# Patient Record
Sex: Female | Born: 1985 | Race: White | Hispanic: No | Marital: Single | Smoking: Never smoker
Health system: Southern US, Community
[De-identification: ages and names within clinical notes are randomized; demographics above are authoritative.]

## PROBLEM LIST (undated history)

## (undated) DIAGNOSIS — F419 Anxiety disorder, unspecified: Secondary | ICD-10-CM

## (undated) DIAGNOSIS — F32A Depression, unspecified: Secondary | ICD-10-CM

## (undated) DIAGNOSIS — E282 Polycystic ovarian syndrome: Secondary | ICD-10-CM

## (undated) DIAGNOSIS — N200 Calculus of kidney: Secondary | ICD-10-CM

## (undated) HISTORY — PX: TYMPANOSTOMY TUBE PLACEMENT: SHX32

---

## 2003-05-01 ENCOUNTER — Emergency Department (HOSPITAL_COMMUNITY): Admission: EM | Admit: 2003-05-01 | Discharge: 2003-05-01 | Payer: Self-pay | Admitting: Emergency Medicine

## 2006-10-15 ENCOUNTER — Other Ambulatory Visit: Admission: RE | Admit: 2006-10-15 | Discharge: 2006-10-15 | Payer: Self-pay | Admitting: Family Medicine

## 2008-11-09 ENCOUNTER — Other Ambulatory Visit: Admission: RE | Admit: 2008-11-09 | Discharge: 2008-11-09 | Payer: Self-pay | Admitting: Family Medicine

## 2011-10-05 ENCOUNTER — Emergency Department (HOSPITAL_COMMUNITY): Payer: BC Managed Care – PPO

## 2011-10-05 ENCOUNTER — Emergency Department (HOSPITAL_COMMUNITY)
Admission: EM | Admit: 2011-10-05 | Discharge: 2011-10-05 | Discharge status: Against Medical Advice | Payer: BC Managed Care – PPO | Attending: Emergency Medicine | Admitting: Emergency Medicine

## 2011-10-05 ENCOUNTER — Encounter: Payer: Self-pay | Admitting: Emergency Medicine

## 2011-10-05 DIAGNOSIS — N200 Calculus of kidney: Secondary | ICD-10-CM | POA: Insufficient documentation

## 2011-10-05 DIAGNOSIS — R197 Diarrhea, unspecified: Secondary | ICD-10-CM | POA: Insufficient documentation

## 2011-10-05 DIAGNOSIS — N201 Calculus of ureter: Secondary | ICD-10-CM

## 2011-10-05 DIAGNOSIS — R109 Unspecified abdominal pain: Secondary | ICD-10-CM | POA: Insufficient documentation

## 2011-10-05 DIAGNOSIS — R112 Nausea with vomiting, unspecified: Secondary | ICD-10-CM | POA: Insufficient documentation

## 2011-10-05 LAB — BASIC METABOLIC PANEL
Calcium: 9.3 mg/dL (ref 8.4–10.5)
Creatinine, Ser: 0.89 mg/dL (ref 0.50–1.10)
GFR calc Af Amer: >90 mL/min (ref >90)

## 2011-10-05 LAB — URINE MICROSCOPIC-ADD ON

## 2011-10-05 LAB — CBC
MCH: 27.7 pg (ref 26.0–34.0)
MCV: 83.9 fL (ref 78.0–100.0)
Platelets: 246 10*3/uL (ref 150–400)
RDW: 13.6 % (ref 11.5–15.5)

## 2011-10-05 LAB — URINALYSIS, ROUTINE W REFLEX MICROSCOPIC
Bilirubin Urine: NEGATIVE
Ketones, ur: NEGATIVE mg/dL
Nitrite: NEGATIVE
Protein, ur: NEGATIVE mg/dL
Urobilinogen, UA: 0.2 mg/dL (ref 0.0–1.0)

## 2011-10-05 IMAGING — CT CT ABD-PELV W/ CM
2 of 4 series · 17 of 46 positions shown, 19 images · IV contrast (omnipaque)
Comparison: None.

CLINICAL DATA: Right lower quadrant abdominal pain.

CT ABDOMEN AND PELVIS WITH CONTRAST
TECHNIQUE: Multidetector CT imaging of the abdomen and pelvis was
performed following the standard protocol during bolus
administration of intravenous contrast.
Contrast: 100mL OMNIPAQUE IOHEXOL 300 MG/ML IV SOLN

[Series 2: abd/pel with · axial · 0.74mm/px · z∈[-368,+12]mm · 14 of 84 slices shown, 16 images]
[im 4/84  soft-tissue]
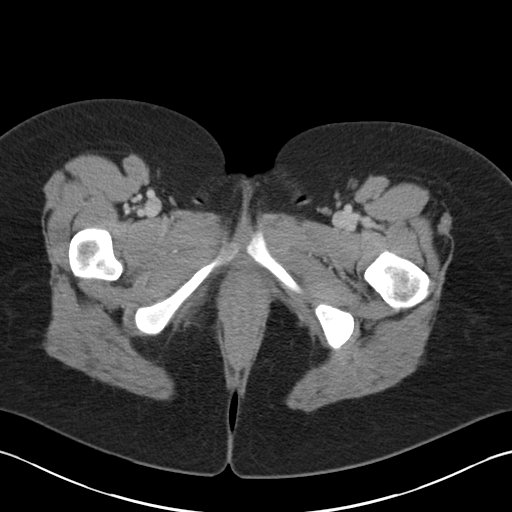
[im 4/84  bone]
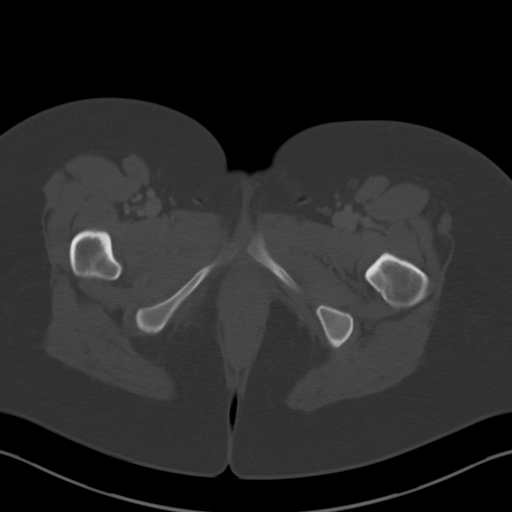
[im 10/84  soft-tissue]
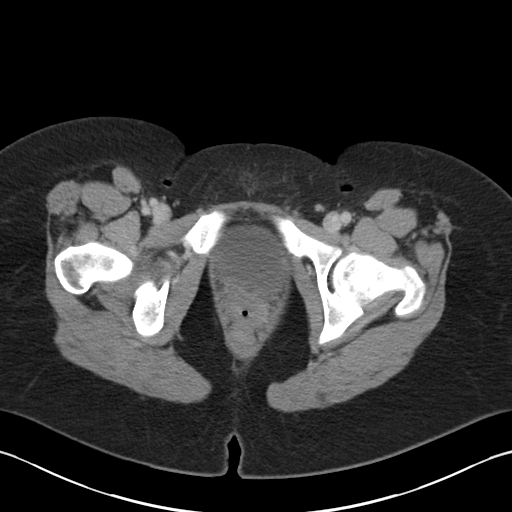
[im 16/84  soft-tissue]
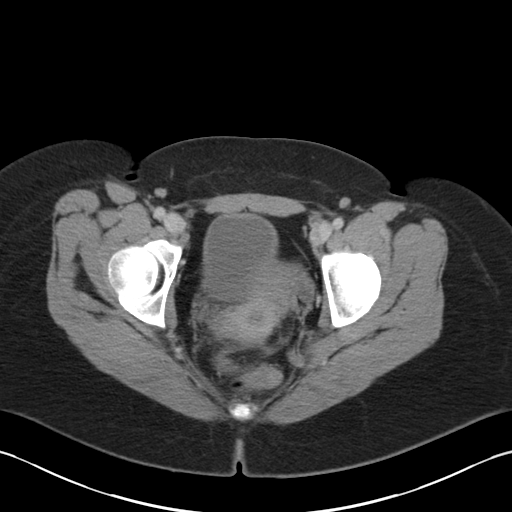
[im 23/84  soft-tissue]
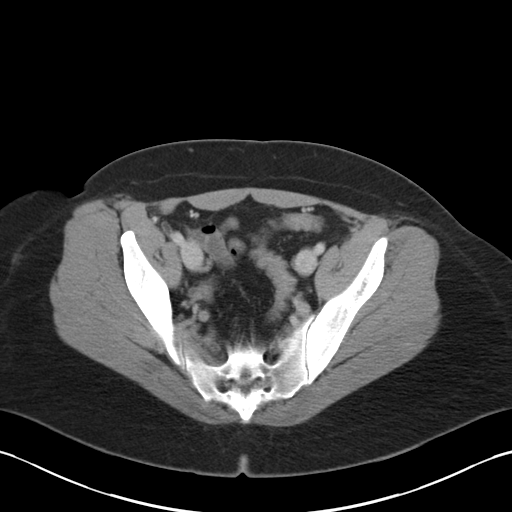
[im 29/84  soft-tissue]
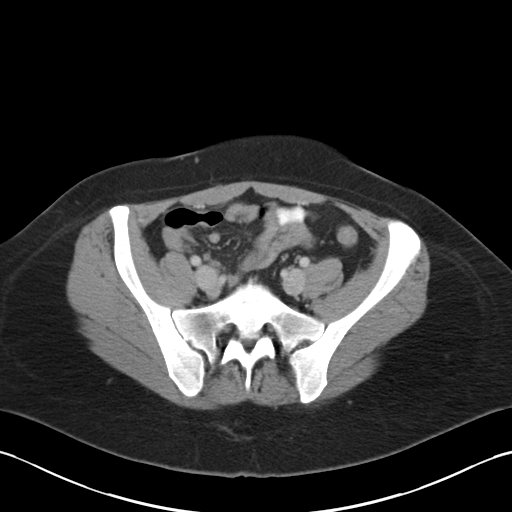
[im 32/84  soft-tissue]
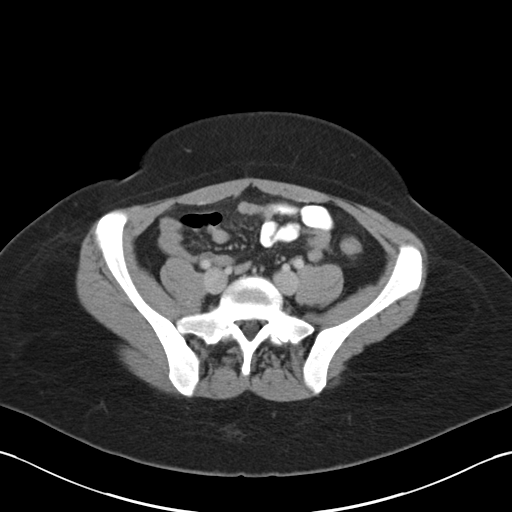
[im 39/84  soft-tissue]
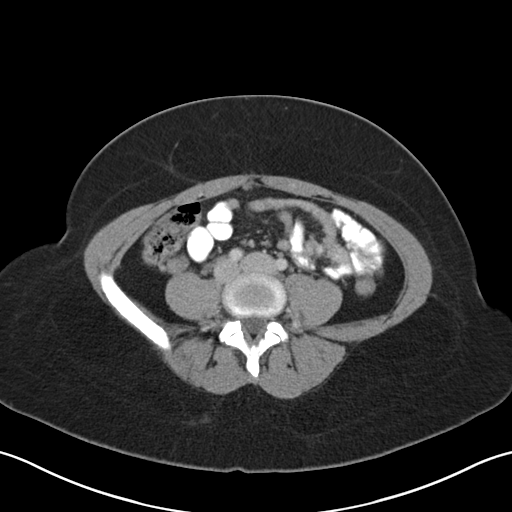
[im 45/84  soft-tissue]
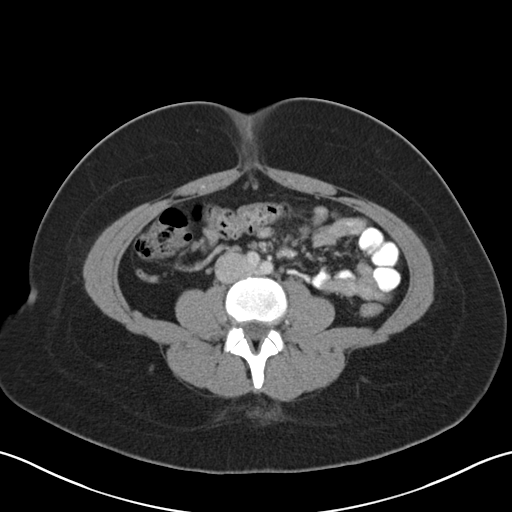
[im 52/84  soft-tissue]
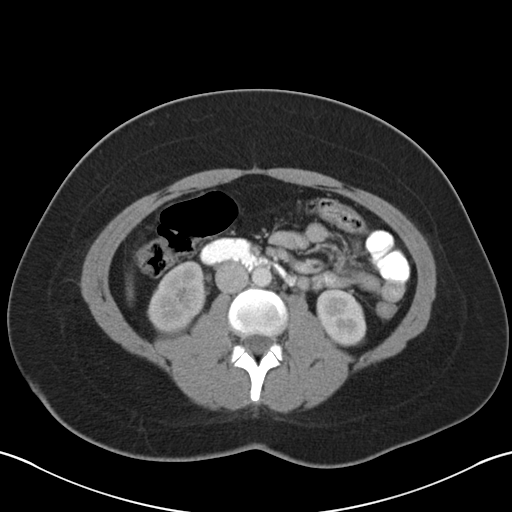
[im 52/84  bone]
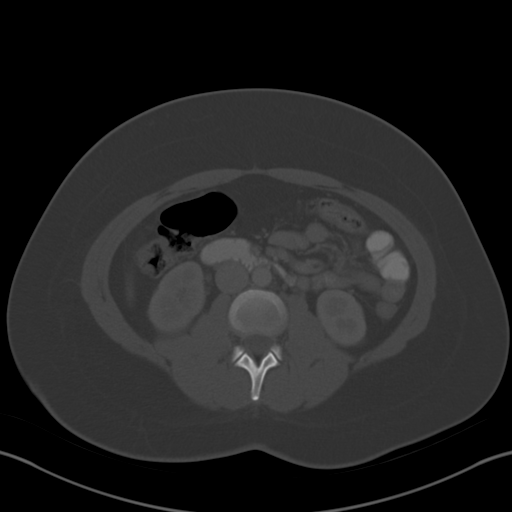
[im 55/84  soft-tissue]
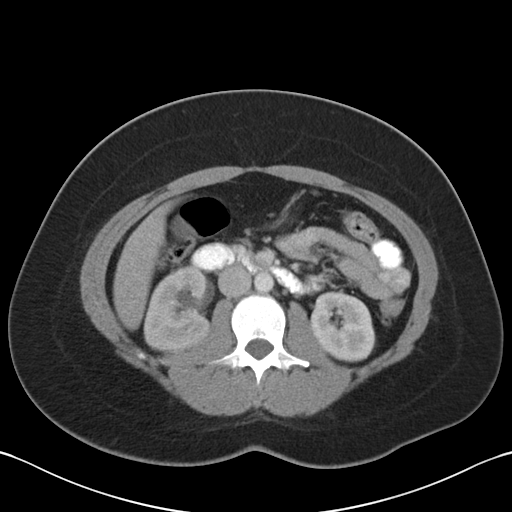
[im 61/84  soft-tissue]
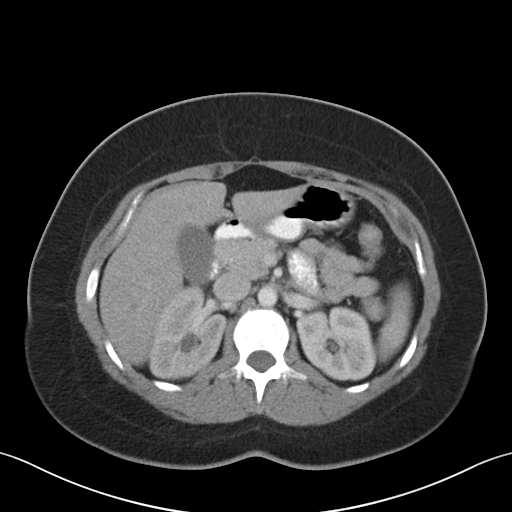
[im 68/84  soft-tissue]
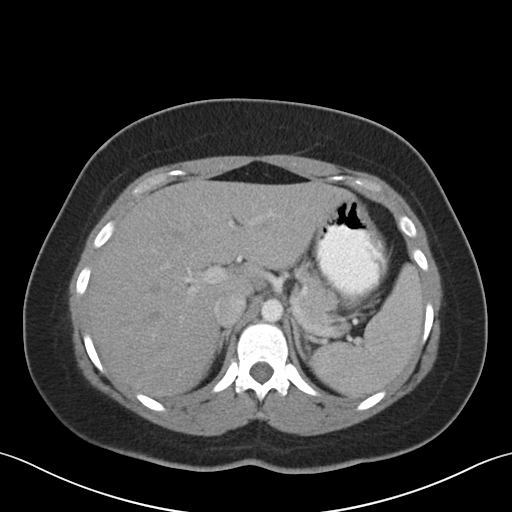
[im 74/84  soft-tissue]
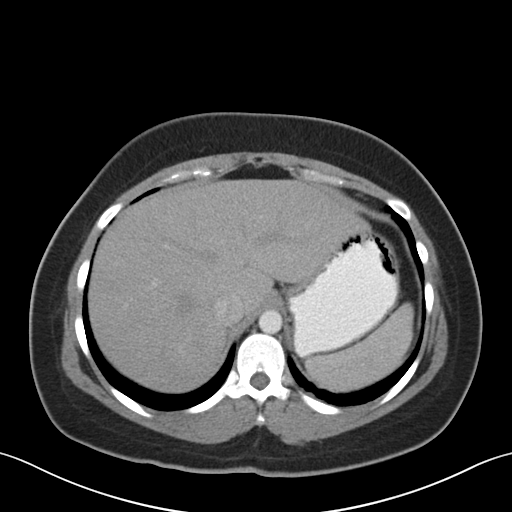
[im 80/84  soft-tissue]
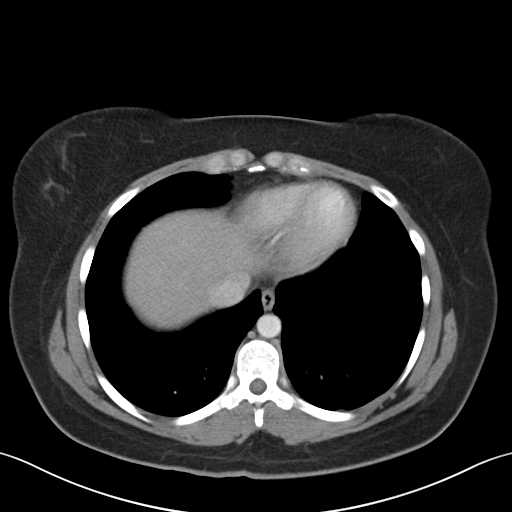

[Series 5: coronal · coronal · 0.81mm/px · 3 of 54 slices shown]
[im 18/54  soft-tissue]
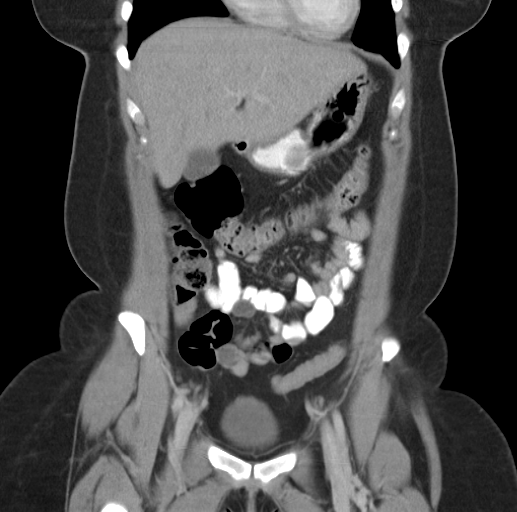
[im 24/54  soft-tissue]
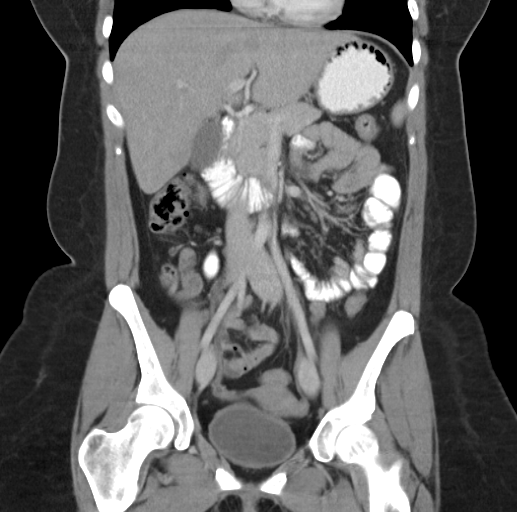
[im 30/54  soft-tissue]
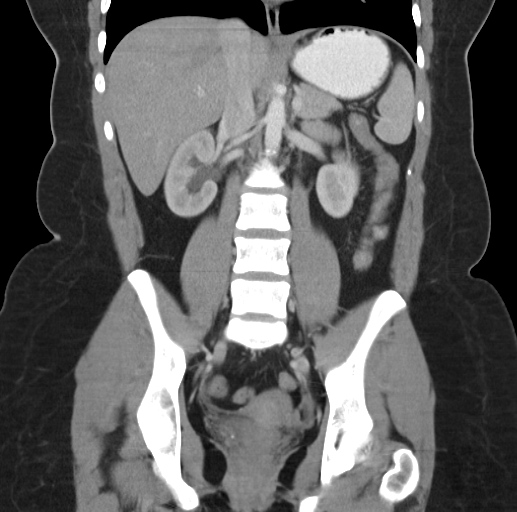

[17 of 46 positions shown; findings below may reference images not displayed]

FINDINGS: There is a 3 mm stone at the right ureterovesical
junction. This causes mild right sided hydroureteronephrosis.
Enhancement of the right kidney is slightly less than the left
kidney, likely related to obstruction.  No definite CT findings of
pyelonephritis.  There is no perinephric stranding.

No definite intrarenal calculi in the right or left kidney (the
sensitivity for detecting intrarenal calculi is decreased on
postcontrast imaging).

There is no hydronephrosis on the left.  The left ureter is normal
in caliber.

The appendix is retrocecal and is normal.

The liver, gallbladder, spleen, adrenal glands, pancreas, and
gallbladder are within normal limits.  Bowel loops are normal in
caliber.  The terminal ileum appears normal.  The uterus and adnexa
are within normal limits.  Urinary bladder appears normal.

There is no ascites or lymphadenopathy.

The abdominal aorta is normal in caliber.

The imaged bones are within normal limits.
IMPRESSION: 1.  3 mm right ureterovesical junction stone results in mild right
hydroureteronephrosis.
2.  Normal appendix.

## 2011-10-05 MED ORDER — MORPHINE SULFATE 2 MG/ML IJ SOLN
INTRAMUSCULAR | Status: AC
  Administered 2011-10-05: 4 mg via INTRAVASCULAR
  Filled 2011-10-05: qty 2

## 2011-10-05 MED ORDER — MORPHINE SULFATE 4 MG/ML IJ SOLN: 4.0000 mg | Freq: Once | INTRAMUSCULAR | Status: DC

## 2011-10-05 MED ORDER — SODIUM CHLORIDE 0.9 % IV BOLUS (SEPSIS)
250.0000 mL | Freq: Once | INTRAVENOUS | Status: AC
  Administered 2011-10-05: 250 mL via INTRAVENOUS

## 2011-10-05 MED ORDER — ONDANSETRON HCL 4 MG PO TABS: 4.0000 mg | ORAL_TABLET | Freq: Four times a day (QID) | ORAL | Status: AC

## 2011-10-05 MED ORDER — OXYCODONE-ACETAMINOPHEN 5-325 MG PO TABS: 1.0000 | ORAL_TABLET | Freq: Four times a day (QID) | ORAL | Status: AC | PRN

## 2011-10-05 MED ORDER — MORPHINE SULFATE 4 MG/ML IJ SOLN: 4.0000 mg | Freq: Once | INTRAMUSCULAR | Status: AC

## 2011-10-05 MED ORDER — MORPHINE SULFATE 2 MG/ML IJ SOLN
INTRAMUSCULAR | Status: AC
  Administered 2011-10-05: 4 mg via INTRAVENOUS
  Filled 2011-10-05: qty 2

## 2011-10-05 MED ORDER — ONDANSETRON HCL 4 MG/2ML IJ SOLN
4.0000 mg | Freq: Once | INTRAMUSCULAR | Status: AC
  Administered 2011-10-05: 4 mg via INTRAVENOUS
  Filled 2011-10-05: qty 2

## 2011-10-05 MED ORDER — MORPHINE SULFATE 4 MG/ML IJ SOLN
4.0000 mg | Freq: Once | INTRAMUSCULAR | Status: AC
  Administered 2011-10-05: 4 mg via INTRAVENOUS

## 2011-10-05 MED ORDER — MORPHINE SULFATE 10 MG/ML IJ SOLN
INTRAMUSCULAR | Status: AC
  Filled 2011-10-05: qty 1

## 2011-10-05 MED ORDER — KETOROLAC TROMETHAMINE 30 MG/ML IJ SOLN
30.0000 mg | Freq: Once | INTRAMUSCULAR | Status: AC
Start: 2011-10-05 — End: 2011-10-05
  Administered 2011-10-05: 30 mg via INTRAVENOUS
  Filled 2011-10-05: qty 1

## 2011-10-05 MED ORDER — IOHEXOL 300 MG/ML  SOLN
100.0000 mL | Freq: Once | INTRAMUSCULAR | Status: AC | PRN
  Administered 2011-10-05: 100 mL via INTRAVENOUS

## 2011-10-05 NOTE — ED Notes (Signed)
Pt. Given a urine cup but can't void at present moment.

## 2011-10-05 NOTE — ED Provider Notes (Signed)
CT History     CSN: [id number] Arrival date & time: 10/05/2011  9:58 AM   First MD Initiated Contact with Patient 10/05/11 1009      Chief Complaint  Patient presents with  . Abdominal Pain    Sudden onset  . Diarrhea    (Consider location/radiation/quality/duration/timing/severity/associated sxs/prior treatment) Patient is a 25 y.o. female presenting with abdominal pain.  Abdominal Pain The primary symptoms of the illness include abdominal pain, nausea, vomiting and diarrhea. The primary symptoms of the illness do not include fever, fatigue, shortness of breath, hematemesis, hematochezia, dysuria, vaginal discharge or vaginal bleeding. The current episode started 3 to 5 hours ago. The onset of the illness was sudden. The problem has been gradually improving.  The patient states that she believes she is currently not pregnant. The patient has not had a change in bowel habit. Additional symptoms associated with the illness include chills and diaphoresis. Symptoms associated with the illness do not include anorexia, heartburn, constipation, urgency, hematuria, frequency or back pain.    Pt woke up this morning with symptoms of severe periumbilical abdominal pain. She says that she had a bought of diarrhea and then vomited while having diarrhea. She was also experiencing back pain. The symptoms started at 7am this morning.  Past Medical History  Diagnosis Date  . Migraine     Past Surgical History  Procedure Date  . Tympanostomy tube placement     History reviewed. No pertinent family history.  History  Substance Use Topics  . Smoking status: Not on file  . Smokeless tobacco: Not on file  . Alcohol Use: Yes    OB History    Grav Para Term Preterm Abortions TAB SAB Ect Mult Living                  Review of Systems  Constitutional: Positive for chills and diaphoresis. Negative for fever and fatigue.  Respiratory: Negative for shortness of breath.   Gastrointestinal:  Positive for nausea, vomiting, abdominal pain and diarrhea. Negative for heartburn, constipation, hematochezia, anorexia and hematemesis.  Genitourinary: Negative for dysuria, urgency, frequency, hematuria, vaginal bleeding and vaginal discharge.  Musculoskeletal: Negative for back pain.  All other systems reviewed and are negative.    Allergies  Penicillins  Home Medications   Current Outpatient Rx  Name Route Sig Dispense Refill  . NORGESTIM-ETH ESTRAD TRIPHASIC 0.18/0.215/0.25 MG-35 MCG PO TABS Oral Take 1 tablet by mouth daily.      . SERTRALINE HCL 100 MG PO TABS Oral Take 200 mg by mouth daily. 2 tabs     . SIMETHICONE 125 MG PO CHEW Oral Chew 125 mg by mouth every 6 (six) hours as needed. gas     . TOPIRAMATE 100 MG PO TABS Oral Take 100 mg by mouth at bedtime as needed. migraine     . ELETRIPTAN HYDROBROMIDE 40 MG PO TABS Oral One tablet by mouth as needed for migraine headache.  If the headache improves and then returns, dose may be repeated after 2 hours have elapsed since first dose (do not exceed 80 mg per day). may repeat in 2 hours if necessary migraine     . ONDANSETRON HCL 4 MG PO TABS Oral Take 1 tablet (4 mg total) by mouth every 6 (six) hours. 12 tablet 0  . OXYCODONE-ACETAMINOPHEN 5-325 MG PO TABS Oral Take 1 tablet by mouth every 6 (six) hours as needed for pain. 6 tablet 0    BP 117/69  Pulse 76  Temp(Src) 97.6 F (36.4 C) (Oral)  Resp 16  SpO2 100%  LMP 09/17/2011  Physical Exam  Nursing note and vitals reviewed. Constitutional: She appears well-developed and well-nourished.  HENT:  Head: Normocephalic and atraumatic.  Eyes: Conjunctivae are normal. Pupils are equal, round, and reactive to light.  Neck: Trachea normal, normal range of motion and full passive range of motion without pain. Neck supple.  Cardiovascular: Normal rate, regular rhythm and normal pulses.   Pulmonary/Chest: Effort normal and breath sounds normal. Chest wall is not dull to  percussion. She exhibits no tenderness, no crepitus, no edema, no deformity and no retraction.  Abdominal: Soft. Normal appearance and bowel sounds are normal. She exhibits no distension and no mass. There is tenderness (mild periumbilical pain internally. no increased pain on palpation). There is no rebound and no guarding.  Musculoskeletal: Normal range of motion.  Neurological: She is alert. She has normal strength.  Skin: Skin is warm, dry and intact.  Psychiatric: She has a normal mood and affect. Her speech is normal and behavior is normal. Judgment and thought content normal. Cognition and memory are normal.    ED Course  Procedures (including critical care time)  Labs Reviewed  URINALYSIS, ROUTINE W REFLEX MICROSCOPIC - Abnormal; Notable for the following:    APPearance CLOUDY (*)    Hgb urine dipstick LARGE (*)    Leukocytes, UA SMALL (*)    All other components within normal limits  CBC - Abnormal; Notable for the following:    WBC 13.9 (*)    All other components within normal limits  BASIC METABOLIC PANEL - Abnormal; Notable for the following:    Glucose, Bld 140 (*)    GFR calc non Af Amer 88 (*)    All other components within normal limits  URINE MICROSCOPIC-ADD ON - Abnormal; Notable for the following:    Squamous Epithelial / LPF FEW (*)    All other components within normal limits  PREGNANCY, URINE   Ct Abdomen Pelvis W Contrast  10/05/2011  *RADIOLOGY REPORT*  Clinical Data: Right lower quadrant abdominal pain.  CT ABDOMEN AND PELVIS WITH CONTRAST  Technique:  Multidetector CT imaging of the abdomen and pelvis was performed following the standard protocol during bolus administration of intravenous contrast.  Contrast: OMNIPAQUE IOHEXOL 300 MG/ML IV SOLN  Comparison: None.  Findings: There is a 3 mm stone at the right ureterovesical junction. This causes mild right sided hydroureteronephrosis. Enhancement of the right kidney is slightly less than the left kidney,  likely related to obstruction.  No definite CT findings of pyelonephritis.  There is no perinephric stranding.  No definite intrarenal calculi in the right or left kidney (the sensitivity for detecting intrarenal calculi is decreased on postcontrast imaging).  There is no hydronephrosis on the left.  The left ureter is normal in caliber.  The appendix is retrocecal and is normal.  The liver, gallbladder, spleen, adrenal glands, pancreas, and gallbladder are within normal limits.  Bowel loops are normal in caliber.  The terminal ileum appears normal.  The uterus and adnexa are within normal limits.  Urinary bladder appears normal.  There is no ascites or lymphadenopathy.  The abdominal aorta is normal in caliber.  The imaged bones are within normal limits.  IMPRESSION:  1.  3 mm right ureterovesical junction stone results in mild right hydroureteronephrosis. 2.  Normal appendix.  Original Report Authenticated By: Britta Mccreedy, M.D.     1. Ureteral stone  MDM  pts pain easily controlled with a small dose of pain medications and fluids.  pts pain suddenly became severe again. With nausea and vomiting. Ordered CT scan which shows a right kidney stone that is 3mm. No hx of stones.        Dorthula Matas, PA 10/05/11 1352  Dorthula Matas, PA 10/05/11 1352  Dorthula Matas, PA 10/05/11 1610

## 2011-10-05 NOTE — ED Notes (Signed)
Pt up and amb to BR.

## 2011-10-05 NOTE — ED Notes (Signed)
Pt c/o abd  Pain radiating to back and having fever and chills with n/v onset this am.

## 2011-10-06 NOTE — ED Provider Notes (Signed)
Medical screening examination/treatment/procedure(s) were performed by non-physician practitioner and as supervising physician I was immediately available for consultation/collaboration.   Gerhard Munch, MD 10/06/11 [phone]

## 2011-11-20 ENCOUNTER — Other Ambulatory Visit (HOSPITAL_COMMUNITY)
Admission: RE | Admit: 2011-11-20 | Discharge: 2011-11-20 | Disposition: A | Discharge status: Home / Self Care | Payer: BC Managed Care – PPO | Source: Ambulatory Visit | Attending: Family Medicine | Admitting: Family Medicine

## 2011-11-20 ENCOUNTER — Other Ambulatory Visit: Payer: Self-pay | Admitting: Family Medicine

## 2011-11-20 DIAGNOSIS — Z0000 Encounter for general adult medical examination without abnormal findings: Secondary | ICD-10-CM | POA: Insufficient documentation

## 2014-12-11 ENCOUNTER — Other Ambulatory Visit (HOSPITAL_COMMUNITY)
Admission: RE | Admit: 2014-12-11 | Discharge: 2014-12-11 | Disposition: A | Discharge status: Home / Self Care | Payer: No Typology Code available for payment source | Source: Ambulatory Visit | Attending: Family Medicine | Admitting: Family Medicine

## 2014-12-11 DIAGNOSIS — Z113 Encounter for screening for infections with a predominantly sexual mode of transmission: Secondary | ICD-10-CM | POA: Diagnosis present

## 2014-12-11 DIAGNOSIS — Z124 Encounter for screening for malignant neoplasm of cervix: Secondary | ICD-10-CM | POA: Diagnosis not present

## 2015-12-13 ENCOUNTER — Other Ambulatory Visit (HOSPITAL_COMMUNITY)
Admission: RE | Admit: 2015-12-13 | Discharge: 2015-12-13 | Disposition: A | Discharge status: Home / Self Care | Payer: Managed Care, Other (non HMO) | Source: Ambulatory Visit | Attending: Family Medicine | Admitting: Family Medicine

## 2015-12-13 ENCOUNTER — Other Ambulatory Visit: Payer: Self-pay | Admitting: Family Medicine

## 2015-12-13 DIAGNOSIS — N760 Acute vaginitis: Secondary | ICD-10-CM | POA: Diagnosis present

## 2015-12-13 DIAGNOSIS — Z113 Encounter for screening for infections with a predominantly sexual mode of transmission: Secondary | ICD-10-CM | POA: Insufficient documentation

## 2015-12-16 LAB — CERVICOVAGINAL ANCILLARY ONLY
CHLAMYDIA, DNA PROBE: NEGATIVE
NEISSERIA GONORRHEA: NEGATIVE
TRICH (WINDOWPATH): NEGATIVE

## 2016-02-11 ENCOUNTER — Encounter (HOSPITAL_COMMUNITY): Payer: Self-pay | Admitting: *Deleted

## 2016-02-11 DIAGNOSIS — Y9389 Activity, other specified: Secondary | ICD-10-CM | POA: Insufficient documentation

## 2016-02-11 DIAGNOSIS — Z3202 Encounter for pregnancy test, result negative: Secondary | ICD-10-CM | POA: Diagnosis not present

## 2016-02-11 DIAGNOSIS — Y998 Other external cause status: Secondary | ICD-10-CM | POA: Insufficient documentation

## 2016-02-11 DIAGNOSIS — T7421XA Adult sexual abuse, confirmed, initial encounter: Secondary | ICD-10-CM | POA: Diagnosis present

## 2016-02-11 DIAGNOSIS — Y9289 Other specified places as the place of occurrence of the external cause: Secondary | ICD-10-CM | POA: Insufficient documentation

## 2016-02-11 DIAGNOSIS — Z0441 Encounter for examination and observation following alleged adult rape: Secondary | ICD-10-CM | POA: Diagnosis not present
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
## 2016-02-12 ENCOUNTER — Emergency Department (HOSPITAL_COMMUNITY)
Admission: EM | Admit: 2016-02-12 | Discharge: 2016-02-12 | Disposition: A | Discharge status: Home / Self Care | Payer: Managed Care, Other (non HMO) | Attending: Emergency Medicine | Admitting: Emergency Medicine

## 2016-02-12 ENCOUNTER — Ambulatory Visit (HOSPITAL_COMMUNITY)
Admission: EM | Admit: 2016-02-12 | Discharge: 2016-02-12 | Disposition: A | Discharge status: Home / Self Care | Payer: No Typology Code available for payment source | Source: Ambulatory Visit | Attending: Emergency Medicine | Admitting: Emergency Medicine

## 2016-02-12 DIAGNOSIS — T7421XA Adult sexual abuse, confirmed, initial encounter: Secondary | ICD-10-CM

## 2016-02-12 DIAGNOSIS — Z0441 Encounter for examination and observation following alleged adult rape: Secondary | ICD-10-CM | POA: Insufficient documentation

## 2016-02-12 LAB — POC URINE PREG, ED: PREG TEST UR: NEGATIVE

## 2016-02-12 MED ORDER — METRONIDAZOLE 500 MG PO TABS
2000.0000 mg | ORAL_TABLET | Freq: Once | ORAL | Status: AC
  Administered 2016-02-12: 2000 mg via ORAL
  Filled 2016-02-12: qty 4

## 2016-02-12 MED ORDER — AZITHROMYCIN 250 MG PO TABS
1000.0000 mg | ORAL_TABLET | Freq: Once | ORAL | Status: AC
  Administered 2016-02-12: 1000 mg via ORAL
  Filled 2016-02-12: qty 4

## 2016-02-12 MED ORDER — CEFTRIAXONE SODIUM 250 MG IJ SOLR
250.0000 mg | Freq: Once | INTRAMUSCULAR | Status: AC
  Administered 2016-02-12: 250 mg via INTRAMUSCULAR
  Filled 2016-02-12: qty 250

## 2016-02-12 MED ORDER — AZITHROMYCIN 1 G PO PACK: 1.0000 g | PACK | Freq: Once | ORAL | Status: DC

## 2016-02-12 MED ORDER — LIDOCAINE HCL (PF) 1 % IJ SOLN
2.0000 mL | Freq: Once | INTRAMUSCULAR | Status: AC
  Administered 2016-02-12: 0.9 mL

## 2016-02-12 NOTE — ED Notes (Signed)
SANE RN called 
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
## 2016-02-12 NOTE — ED Notes (Signed)
Sane RN Educational psychologistharron at bedside

## 2016-02-12 NOTE — ED Notes (Signed)
SANE RN Clois DupesSharron at bedside. She it going to administer the medications to the pt and discharge the pt.

## 2016-02-12 NOTE — Discharge Instructions (Signed)
Sexual Assault or Rape °Sexual assault is any sexual activity that a person is forced, threatened, or coerced into participating in. It may or may not involve physical contact. You are being sexually abused if you are forced to have sexual contact of any kind. Sexual assault is called rape if penetration has occurred (vaginal, oral, or anal). Many times, sexual assaults are committed by a friend, relative, or associate. Sexual assault and rape are never the victim's fault.  °Sexual assault can result in various health problems for the person who was assaulted. Some of these problems include: °· Physical injuries in the genital area or other areas of the body. °· Risk of unwanted pregnancy. °· Risk of sexually transmitted infections (STIs). °· Psychological problems such as anxiety, depression, or posttraumatic stress disorder. °WHAT STEPS SHOULD BE TAKEN AFTER A SEXUAL ASSAULT? °If you have been sexually assaulted, you should take the following steps as soon as possible: °· Go to a safe area as quickly as possible and call your local emergency services (911 in [place] Get away from the area where you have been attacked.   °· Do not wash, shower, comb your hair, or clean any part of your body.   °· Do not change your clothes.   °· Do not remove or touch anything in the area where you were assaulted.   °· Go to an emergency room for a complete physical exam. Get the necessary tests to protect yourself from STIs or pregnancy. You may be treated for an STI even if no signs of one are present. Emergency contraceptive medicines are also available to help prevent pregnancy, if this is desired. You may need to be examined by a specially trained health care provider. °· Have the health care provider collect evidence during the exam, even if you are not sure if you will file a report with the police. °· Find out how to file the correct papers with the authorities. This is important for all assaults, even if they were committed  by a family member or friend. °· Find out where you can get additional help and support, such as a local rape crisis center. °· Follow up with your health care provider as directed.   °HOW CAN YOU REDUCE THE CHANCES OF SEXUAL ASSAULT? °Take the following steps to help reduce your chances of being sexually assaulted: °· Consider carrying mace or pepper spray for protection against an attacker.   °· Consider taking a self-defense course. °· Do not try to fight off an attacker if he or she has a gun or knife.   °· Be aware of your surroundings, what is happening around you, and who might be there.   °· Be assertive, trust your instincts, and walk with confidence and direction. °· Be careful not to drink too much alcohol or use other intoxicants. These can reduce your ability to fight off an assault. °· Always lock your doors and windows. Be sure to have high-quality locks for your home.   °· Do not let people enter your house if you do not know them.   °· Get a home security system that has a siren if you are able.   °· Protect the keys to your house and car. Do not lend them out. Do not put your name and address on them. If you lose them, get your locks changed.   °· Always lock your car and have your key ready to open the door before approaching the car.   °· Park in a well-lit and busy area. °· Plan your driving routes   so that you travel on well-lit and frequently used streets.  °· Keep your car serviced. Always have at least half a tank of gas in it.   °· Do not go into isolated areas alone. This includes open garages, empty buildings or offices, or public laundry rooms.   °· Do not walk or jog alone, especially when it is dark.   °· Never hitchhike.   °· If your car breaks down, call the police for help on your cell phone and stay inside the car with your doors locked and windows up.   °· If you are being followed, go to a busy area and call for help.   °· If you are stopped by a police officer, especially one in  an unmarked police car, keep your door locked. Do not put your window down all the way. Ask the officer to show you identification first.   °· Be aware of "date rape drugs" that can be placed in a drink when you are not looking. These drugs can make you unable to fight off an assault. °FOR MORE INFORMATION °· Office on Women's Health, [place] Department of Health and Human Services: www.womenshealth.gov/violence-against-women/types-of-violence/sexual-assault-and-abuse.html °· National Sexual Assault Hotline: 1-800-656-HOPE (4673) °· National Domestic Violence Hotline: 1-800-799-SAFE (7233) or www.thehotline.org °  °This information is not intended to replace advice given to you by your health care provider. Make sure you discuss any questions you have with your health care provider. °  °Document Released: 10/09/2000 Document Revised: 06/14/2013 Document Reviewed: 05/17/2015 °Elsevier Interactive Patient Education ©2016 [employer] Inc[employer] ° °
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
## 2016-12-21 DIAGNOSIS — F324 Major depressive disorder, single episode, in partial remission: Secondary | ICD-10-CM | POA: Diagnosis not present

## 2016-12-21 DIAGNOSIS — R5383 Other fatigue: Secondary | ICD-10-CM | POA: Diagnosis not present

## 2017-03-25 DIAGNOSIS — F339 Major depressive disorder, recurrent, unspecified: Secondary | ICD-10-CM | POA: Diagnosis not present

## 2017-03-25 DIAGNOSIS — N946 Dysmenorrhea, unspecified: Secondary | ICD-10-CM | POA: Diagnosis not present

## 2017-03-25 DIAGNOSIS — J3489 Other specified disorders of nose and nasal sinuses: Secondary | ICD-10-CM | POA: Diagnosis not present

## 2017-03-25 DIAGNOSIS — Z0000 Encounter for general adult medical examination without abnormal findings: Secondary | ICD-10-CM | POA: Diagnosis not present

## 2017-03-25 DIAGNOSIS — F5101 Primary insomnia: Secondary | ICD-10-CM | POA: Diagnosis not present

## 2017-07-29 DIAGNOSIS — Z3041 Encounter for surveillance of contraceptive pills: Secondary | ICD-10-CM | POA: Diagnosis not present

## 2017-07-29 DIAGNOSIS — Z23 Encounter for immunization: Secondary | ICD-10-CM | POA: Diagnosis not present

## 2017-07-29 DIAGNOSIS — L659 Nonscarring hair loss, unspecified: Secondary | ICD-10-CM | POA: Diagnosis not present

## 2017-07-29 DIAGNOSIS — F339 Major depressive disorder, recurrent, unspecified: Secondary | ICD-10-CM | POA: Diagnosis not present

## 2017-10-30 IMAGING — US US PELVIS COMPLETE WITH TRANSVAGINAL
1 series · 13 of 25 positions shown · non-contrast
Comparison: None

CLINICAL DATA: Dysfunctional uterine bleeding

EXAM:
TRANSABDOMINAL AND TRANSVAGINAL ULTRASOUND OF PELVIS
TECHNIQUE: Both transabdominal and transvaginal ultrasound examinations of the
pelvis were performed. Transabdominal technique was performed for
global imaging of the pelvis including uterus, ovaries, adnexal
regions, and pelvic cul-de-sac. It was necessary to proceed with
endovaginal exam following the transabdominal exam to visualize the
uterus endometrium and ovaries.

[Series 1: us pelvis complete with transvaginal · 0.25mm/px · 13 of 71 slices shown]
[im 1/71]
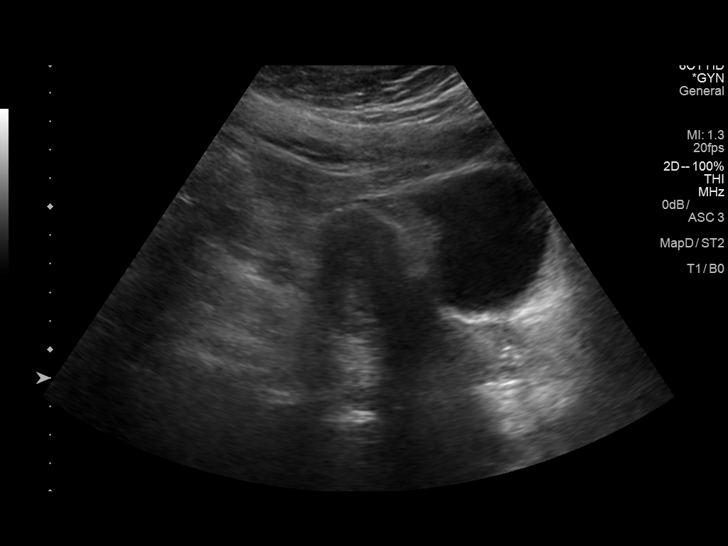
[im 6/71]
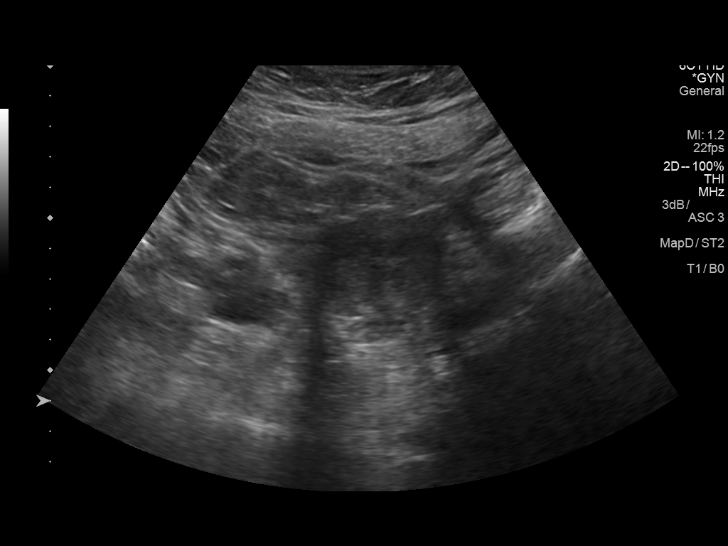
[im 12/71]
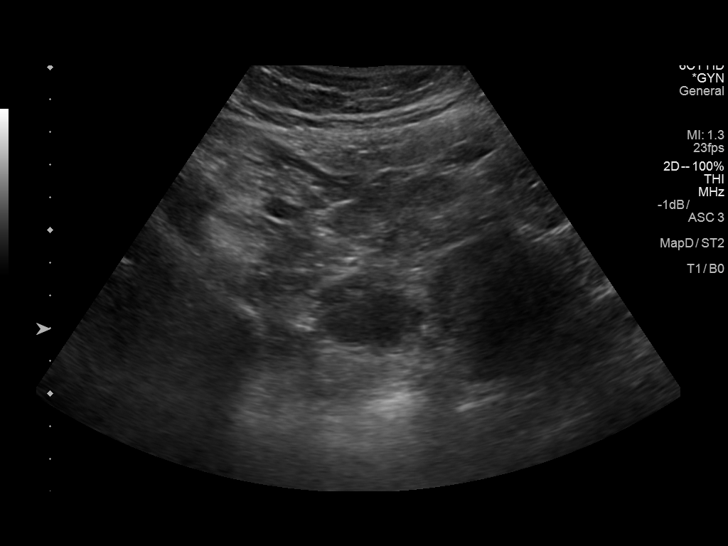
[im 18/71]
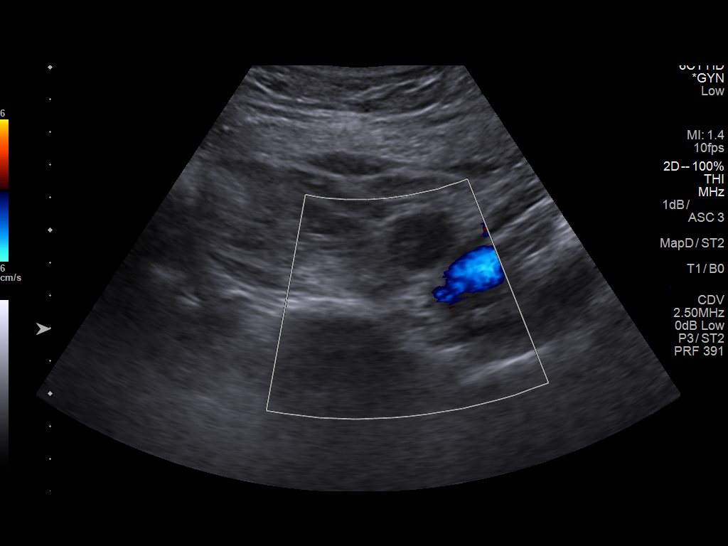
[im 24/71]
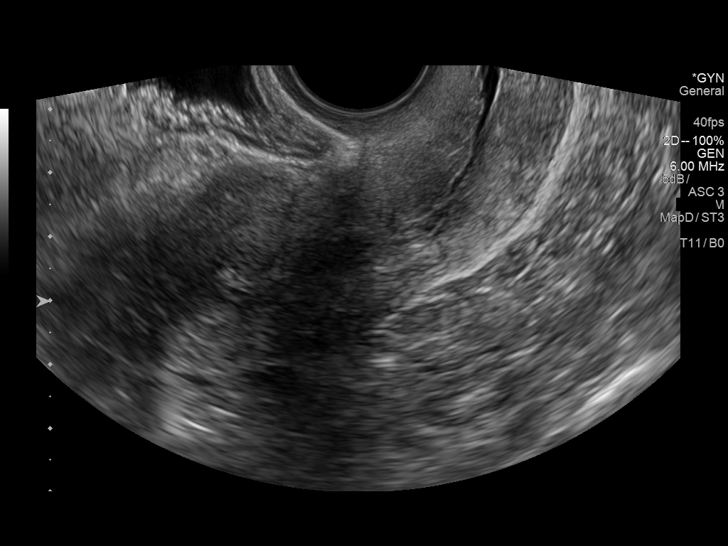
[im 30/71]
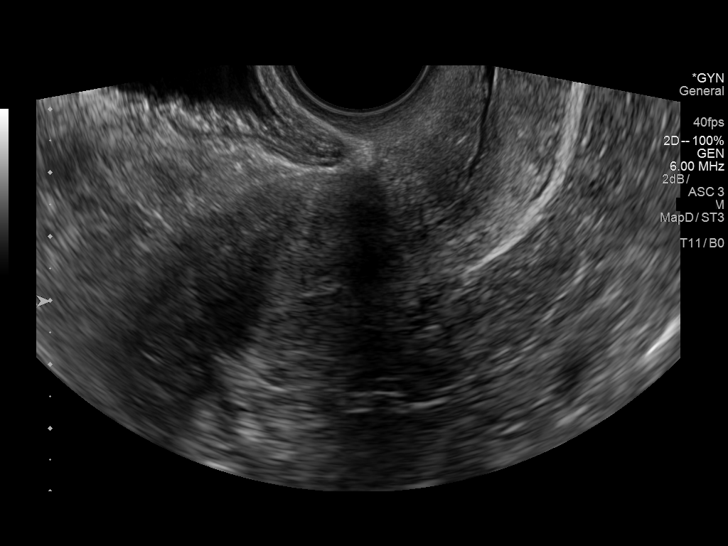
[im 36/71]
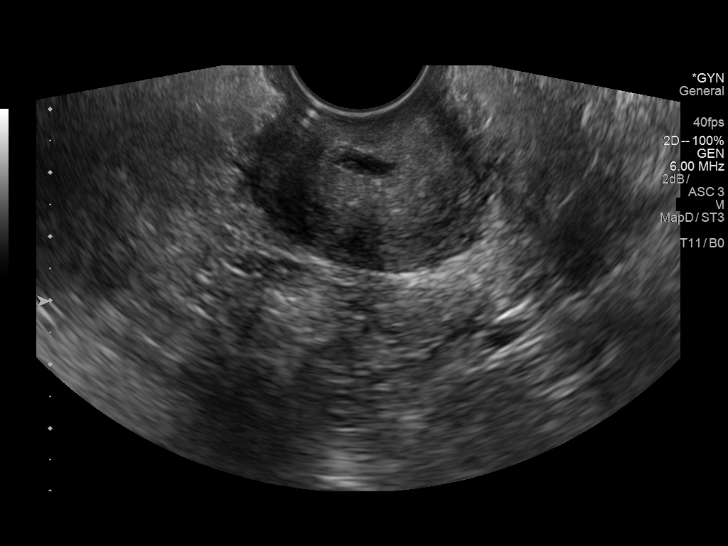
[im 41/71]
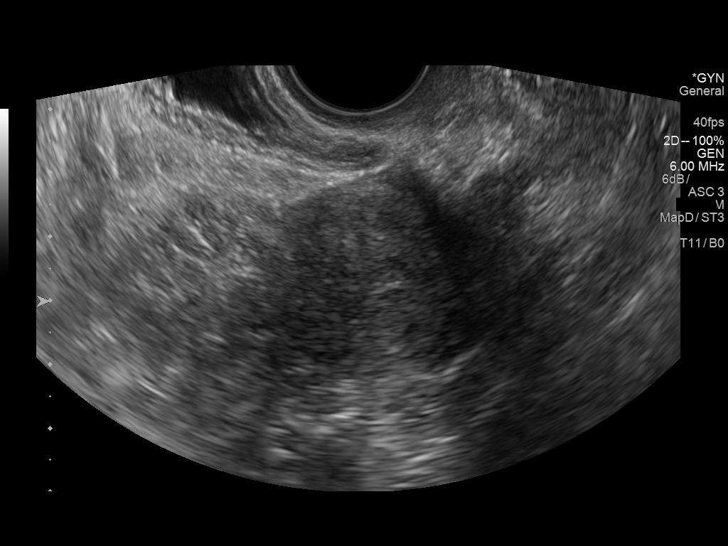
[im 47/71]
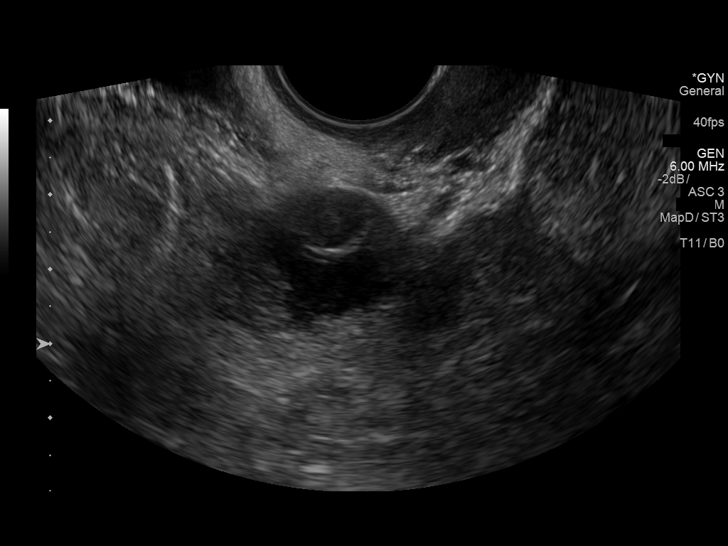
[im 53/71]
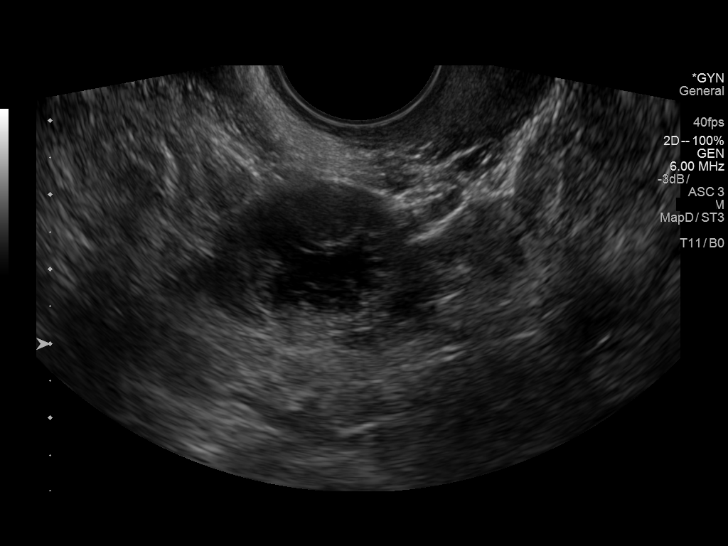
[im 59/71]
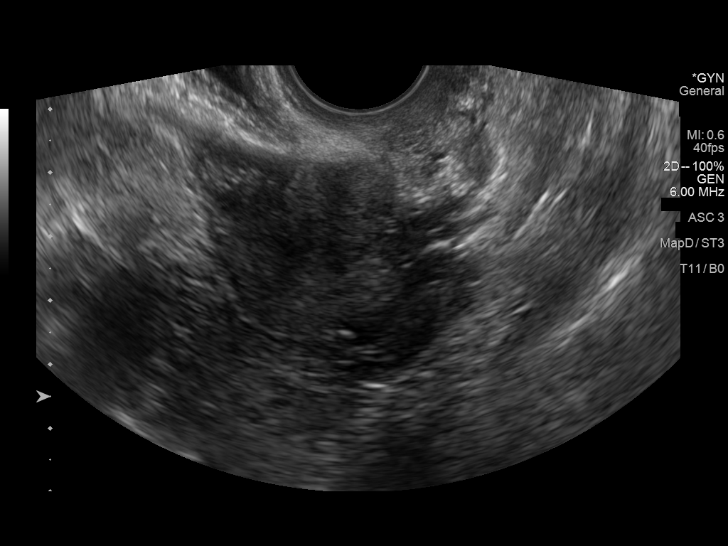
[im 65/71]
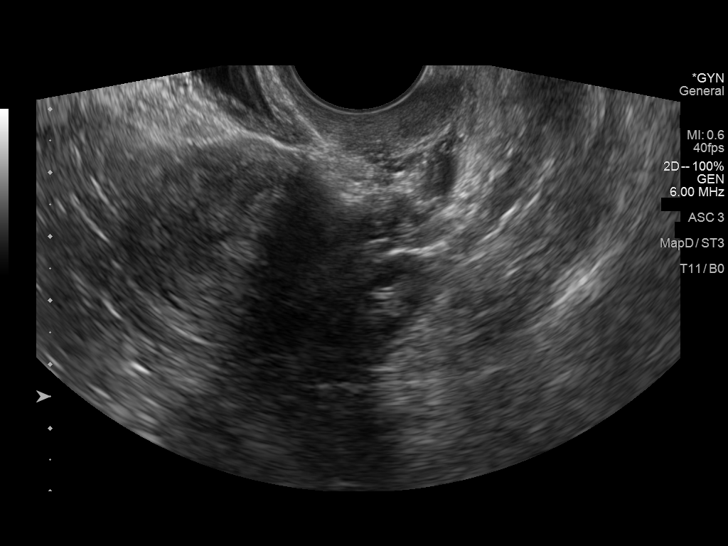
[im 71/71]
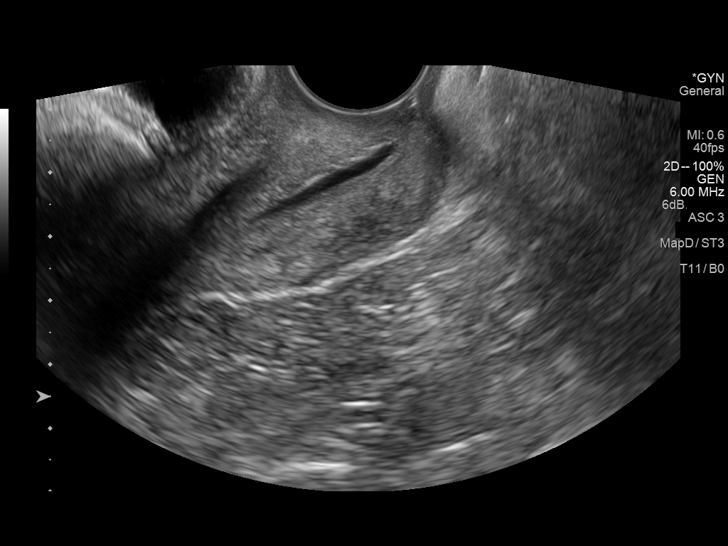

[13 of 25 positions shown; findings below may reference images not displayed]

FINDINGS: Uterus

Measurements: 7.8 x 3.3 x 4.3 cm = volume: 57 mL. No fibroids or
other mass visualized.

Endometrium

Thickness: 4.1 mm.  Small fluid in the endocervical canal

Right ovary

Measurements: 2.5 x 2.3 x 2.2 cm = volume: 6.5 mL. Probable corpus
luteum in the right ovary measuring 2.1 cm

Left ovary

Measurements: 1.9 x 1.3 x 2.6 cm = volume: 3.3 mL. Normal
appearance/no adnexal mass.

Other findings

No abnormal free fluid.
IMPRESSION: 1. Endometrial thickness of 4.1 mm. If bleeding remains unresponsive
to hormonal or medical therapy, sonohysterogram should be considered
for focal lesion work-up. (Ref: Radiological Reasoning: Algorithmic
Workup of Abnormal Vaginal Bleeding with Endovaginal Sonography and
Sonohysterography. AJR [KU]; 191:S68-73)
2. Small amount of fluid in the endocervical canal

## 2017-11-30 DIAGNOSIS — G43909 Migraine, unspecified, not intractable, without status migrainosus: Secondary | ICD-10-CM | POA: Diagnosis not present

## 2017-11-30 DIAGNOSIS — R5382 Chronic fatigue, unspecified: Secondary | ICD-10-CM | POA: Diagnosis not present

## 2017-11-30 DIAGNOSIS — F324 Major depressive disorder, single episode, in partial remission: Secondary | ICD-10-CM | POA: Diagnosis not present

## 2018-03-30 ENCOUNTER — Other Ambulatory Visit (HOSPITAL_COMMUNITY)
Admission: RE | Admit: 2018-03-30 | Discharge: 2018-03-30 | Disposition: A | Discharge status: Home / Self Care | Payer: BLUE CROSS/BLUE SHIELD | Source: Ambulatory Visit | Attending: Family Medicine | Admitting: Family Medicine

## 2018-03-30 ENCOUNTER — Other Ambulatory Visit: Payer: Self-pay | Admitting: Family Medicine

## 2018-03-30 DIAGNOSIS — Z01419 Encounter for gynecological examination (general) (routine) without abnormal findings: Secondary | ICD-10-CM | POA: Diagnosis not present

## 2018-03-30 DIAGNOSIS — F325 Major depressive disorder, single episode, in full remission: Secondary | ICD-10-CM | POA: Diagnosis not present

## 2018-03-30 DIAGNOSIS — Z3041 Encounter for surveillance of contraceptive pills: Secondary | ICD-10-CM | POA: Diagnosis not present

## 2018-03-30 DIAGNOSIS — Z13220 Encounter for screening for lipoid disorders: Secondary | ICD-10-CM | POA: Diagnosis not present

## 2018-03-30 DIAGNOSIS — Z113 Encounter for screening for infections with a predominantly sexual mode of transmission: Secondary | ICD-10-CM | POA: Diagnosis not present

## 2018-03-30 DIAGNOSIS — Z124 Encounter for screening for malignant neoplasm of cervix: Secondary | ICD-10-CM | POA: Diagnosis not present

## 2018-03-30 DIAGNOSIS — Z0000 Encounter for general adult medical examination without abnormal findings: Secondary | ICD-10-CM | POA: Diagnosis not present

## 2018-04-01 LAB — CYTOLOGY - PAP
ADEQUACY: ABSENT — AB
Chlamydia: NEGATIVE
Diagnosis: UNDETERMINED — AB
HPV (WINDOPATH): NOT DETECTED
NEISSERIA GONORRHEA: NEGATIVE

## 2018-05-02 DIAGNOSIS — Z9109 Other allergy status, other than to drugs and biological substances: Secondary | ICD-10-CM | POA: Diagnosis not present

## 2018-05-02 DIAGNOSIS — L989 Disorder of the skin and subcutaneous tissue, unspecified: Secondary | ICD-10-CM | POA: Diagnosis not present

## 2018-05-02 DIAGNOSIS — F329 Major depressive disorder, single episode, unspecified: Secondary | ICD-10-CM | POA: Diagnosis not present

## 2018-05-02 DIAGNOSIS — Z2089 Contact with and (suspected) exposure to other communicable diseases: Secondary | ICD-10-CM | POA: Diagnosis not present

## 2018-09-29 DIAGNOSIS — F419 Anxiety disorder, unspecified: Secondary | ICD-10-CM | POA: Diagnosis not present

## 2018-09-29 DIAGNOSIS — N926 Irregular menstruation, unspecified: Secondary | ICD-10-CM | POA: Diagnosis not present

## 2018-09-29 DIAGNOSIS — F339 Major depressive disorder, recurrent, unspecified: Secondary | ICD-10-CM | POA: Diagnosis not present

## 2018-11-30 DIAGNOSIS — L309 Dermatitis, unspecified: Secondary | ICD-10-CM | POA: Diagnosis not present

## 2018-11-30 DIAGNOSIS — F339 Major depressive disorder, recurrent, unspecified: Secondary | ICD-10-CM | POA: Diagnosis not present

## 2018-11-30 DIAGNOSIS — F419 Anxiety disorder, unspecified: Secondary | ICD-10-CM | POA: Diagnosis not present

## 2018-11-30 DIAGNOSIS — L219 Seborrheic dermatitis, unspecified: Secondary | ICD-10-CM | POA: Diagnosis not present

## 2018-12-06 DIAGNOSIS — N926 Irregular menstruation, unspecified: Secondary | ICD-10-CM | POA: Diagnosis not present

## 2018-12-06 DIAGNOSIS — E669 Obesity, unspecified: Secondary | ICD-10-CM | POA: Diagnosis not present

## 2018-12-06 DIAGNOSIS — E049 Nontoxic goiter, unspecified: Secondary | ICD-10-CM | POA: Diagnosis not present

## 2018-12-06 DIAGNOSIS — E282 Polycystic ovarian syndrome: Secondary | ICD-10-CM | POA: Diagnosis not present

## 2019-01-04 DIAGNOSIS — F419 Anxiety disorder, unspecified: Secondary | ICD-10-CM | POA: Diagnosis not present

## 2019-01-04 DIAGNOSIS — N938 Other specified abnormal uterine and vaginal bleeding: Secondary | ICD-10-CM | POA: Diagnosis not present

## 2019-01-04 DIAGNOSIS — F339 Major depressive disorder, recurrent, unspecified: Secondary | ICD-10-CM | POA: Diagnosis not present

## 2019-01-04 DIAGNOSIS — R0981 Nasal congestion: Secondary | ICD-10-CM | POA: Diagnosis not present

## 2019-01-09 ENCOUNTER — Other Ambulatory Visit: Payer: Self-pay | Admitting: Family Medicine

## 2019-01-09 DIAGNOSIS — N938 Other specified abnormal uterine and vaginal bleeding: Secondary | ICD-10-CM

## 2019-01-13 ENCOUNTER — Ambulatory Visit
Admission: RE | Admit: 2019-01-13 | Discharge: 2019-01-13 | Disposition: A | Discharge status: Home / Self Care | Payer: No Typology Code available for payment source | Source: Ambulatory Visit | Attending: Family Medicine | Admitting: Family Medicine

## 2019-01-13 DIAGNOSIS — N939 Abnormal uterine and vaginal bleeding, unspecified: Secondary | ICD-10-CM | POA: Diagnosis not present

## 2019-01-13 DIAGNOSIS — N938 Other specified abnormal uterine and vaginal bleeding: Secondary | ICD-10-CM

## 2020-02-21 ENCOUNTER — Other Ambulatory Visit: Payer: Self-pay | Admitting: Endocrinology

## 2020-02-21 DIAGNOSIS — E010 Iodine-deficiency related diffuse (endemic) goiter: Secondary | ICD-10-CM

## 2020-03-04 ENCOUNTER — Other Ambulatory Visit: Payer: BLUE CROSS/BLUE SHIELD

## 2020-03-11 ENCOUNTER — Ambulatory Visit
Admission: RE | Admit: 2020-03-11 | Discharge: 2020-03-11 | Disposition: A | Discharge status: Home / Self Care | Payer: 59 | Source: Ambulatory Visit | Attending: Endocrinology | Admitting: Endocrinology

## 2020-03-11 DIAGNOSIS — E010 Iodine-deficiency related diffuse (endemic) goiter: Secondary | ICD-10-CM

## 2020-03-11 IMAGING — US US THYROID
1 series · 13 of 25 positions shown · non-contrast
Comparison: None.

CLINICAL DATA: Palpable abnormality. Thyromegaly on physical
examination.

EXAM:
THYROID ULTRASOUND
TECHNIQUE: Ultrasound examination of the thyroid gland and adjacent soft
tissues was performed.

[Series 1: us thyroid · 0.05mm/px · 13 of 49 slices shown]
[im 1/49]
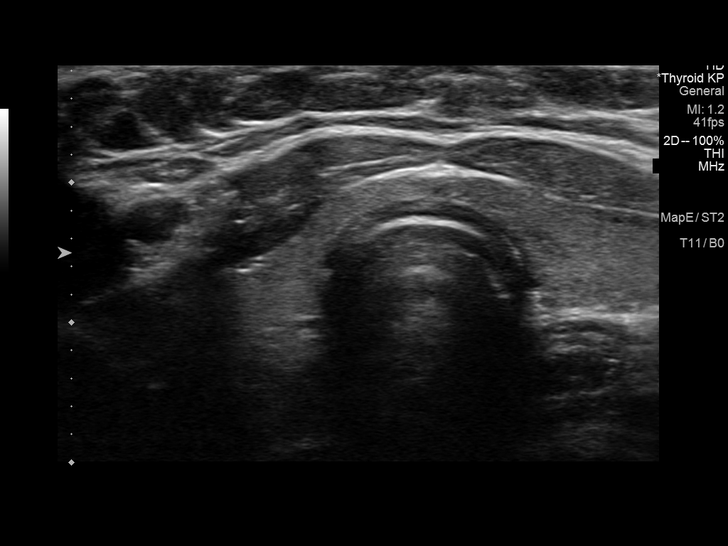
[im 5/49]
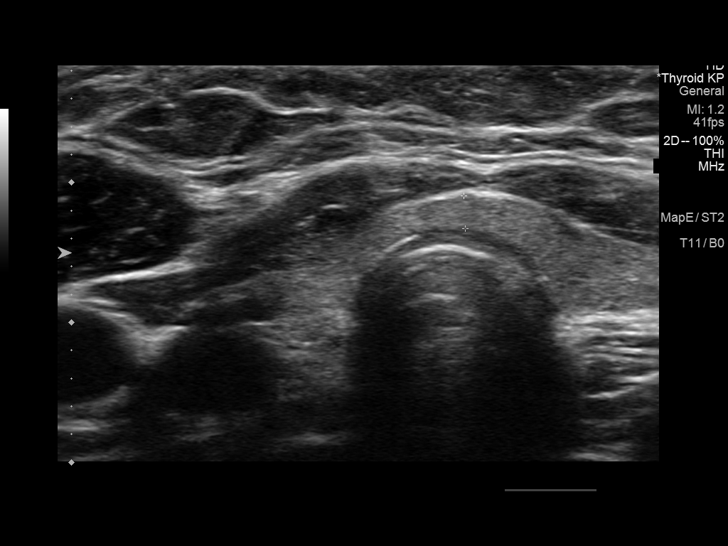
[im 9/49]
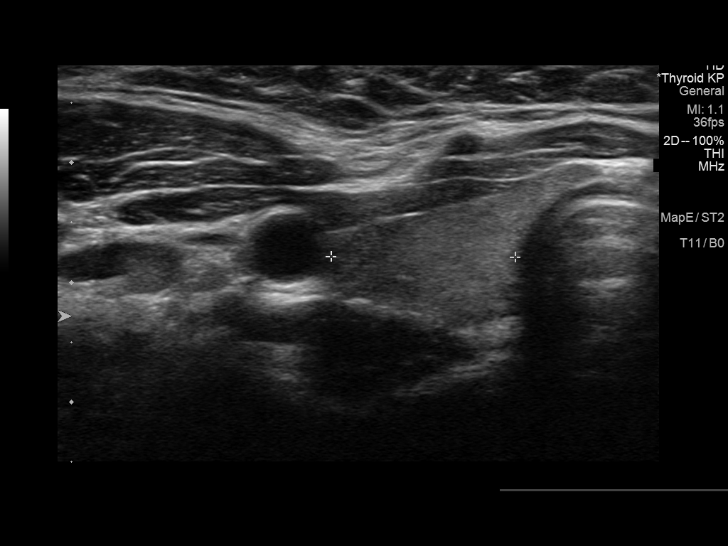
[im 13/49]
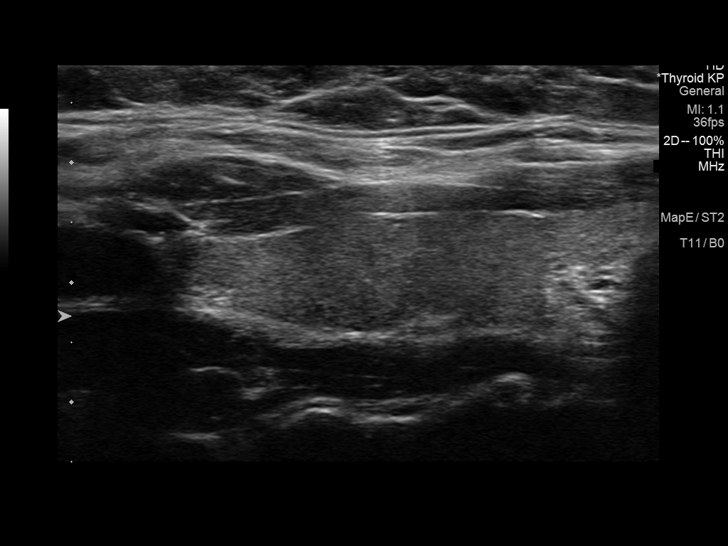
[im 17/49]
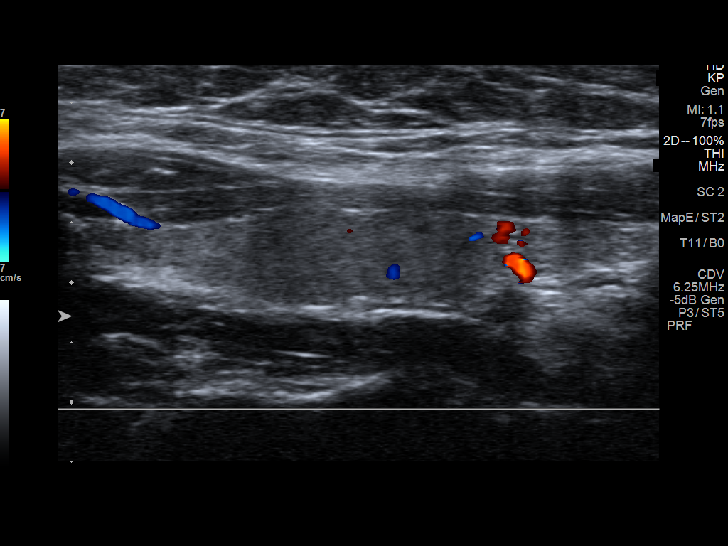
[im 21/49]
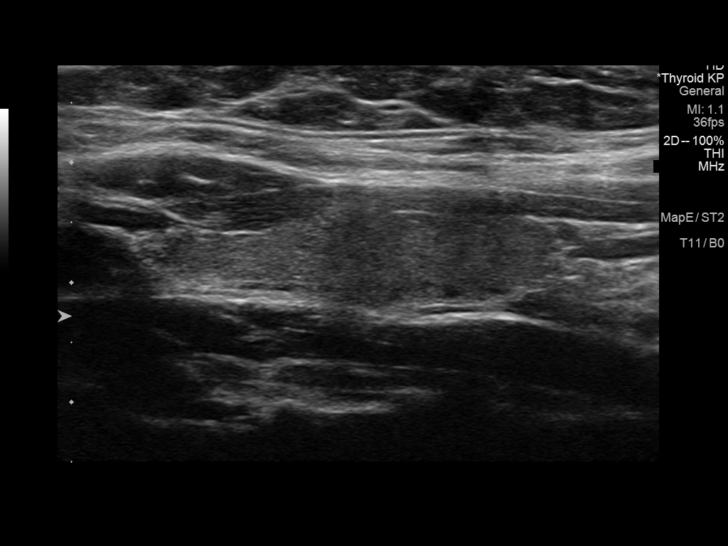
[im 25/49]
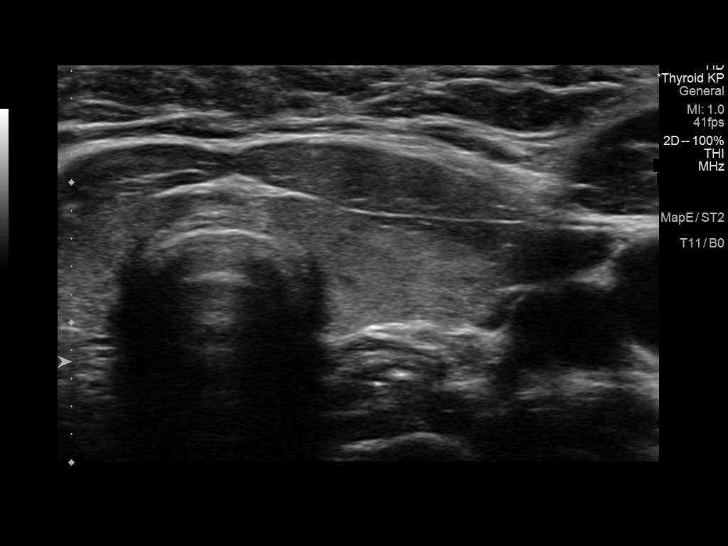
[im 29/49]
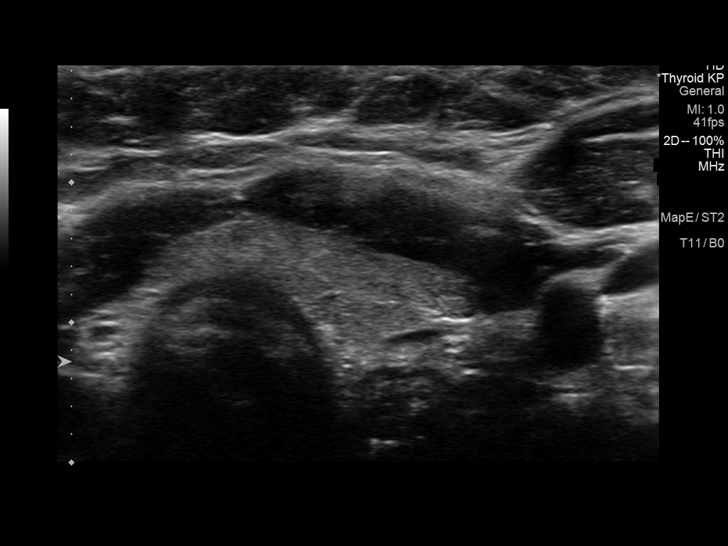
[im 33/49]
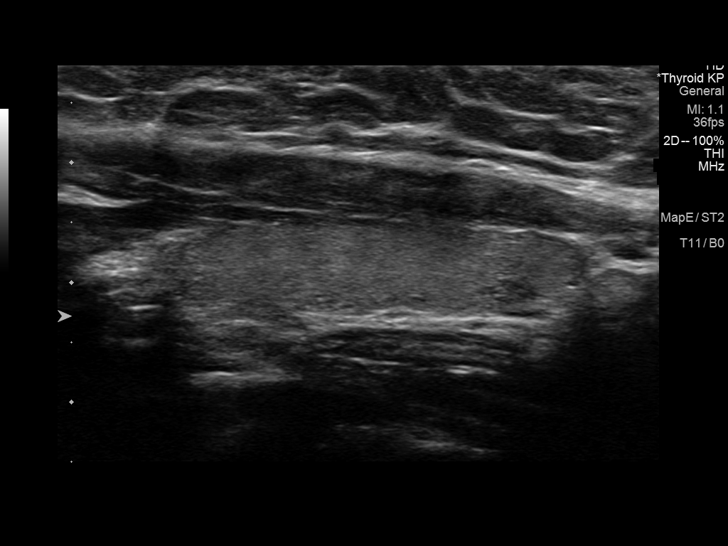
[im 37/49]
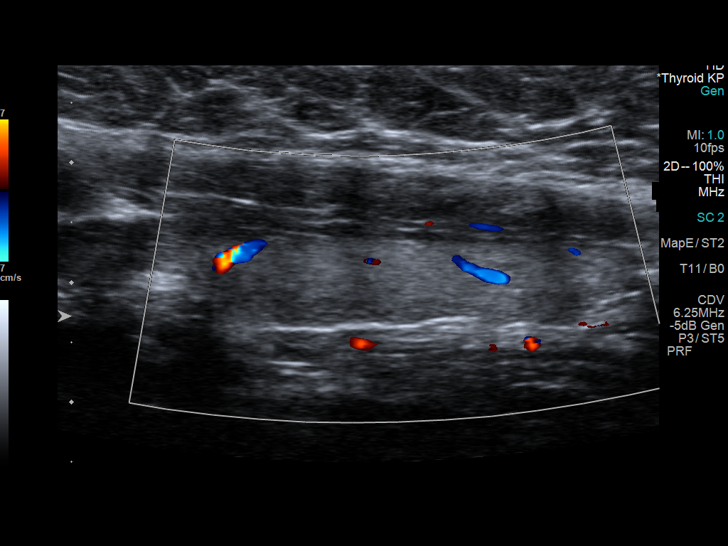
[im 41/49]
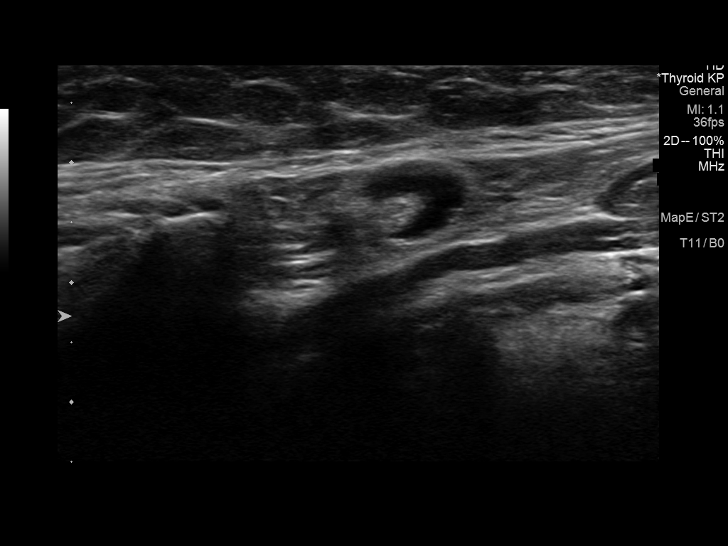
[im 45/49]
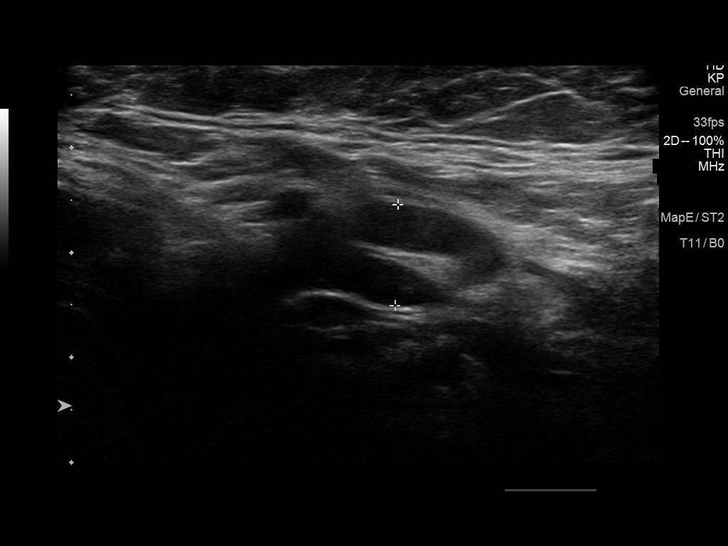
[im 49/49]
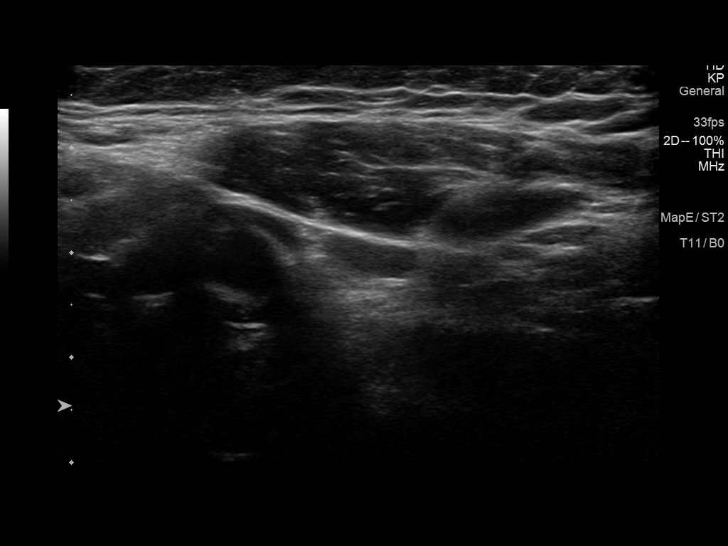

[13 of 25 positions shown; findings below may reference images not displayed]

FINDINGS: Parenchymal Echotexture: Mildly heterogenous

Isthmus: Normal in size measures 0.2 cm in diameter

Right lobe: Slightly atrophic measuring 4.7 x 0.8 x 1.5 cm

Left lobe: Slightly atrophic measuring 3.7 x 0.8 x 1.3 cm

_________________________________________________________

Estimated total number of nodules >/= 1 cm: 0

Number of spongiform nodules >/=  2 cm not described below (TR1): 0

Number of mixed cystic and solid nodules >/= 1.5 cm not described
below (TR2): 0

_________________________________________________________

No discrete nodules are seen within the thyroid gland.

Note is made prominent though non pathologically enlarged bilateral
submandibular chain lymph nodes with index right submandibular node
measuring 0.7 cm in greatest short axis diameter image 42), and
index left submandibular chain lymph node measuring 1 cm in greatest
short axis diameter (image 46), both maintaining benign fatty hila.
IMPRESSION: Mildly atrophic and heterogeneous appearing thyroid without discrete
nodule or mass.

## 2020-07-04 ENCOUNTER — Other Ambulatory Visit: Payer: Self-pay

## 2020-07-04 ENCOUNTER — Encounter (HOSPITAL_BASED_OUTPATIENT_CLINIC_OR_DEPARTMENT_OTHER): Payer: Self-pay

## 2020-07-04 ENCOUNTER — Emergency Department (HOSPITAL_BASED_OUTPATIENT_CLINIC_OR_DEPARTMENT_OTHER)
Admission: EM | Admit: 2020-07-04 | Discharge: 2020-07-04 | Disposition: A | Discharge status: Home / Self Care | Payer: No Typology Code available for payment source | Attending: Emergency Medicine | Admitting: Emergency Medicine

## 2020-07-04 DIAGNOSIS — M546 Pain in thoracic spine: Secondary | ICD-10-CM | POA: Insufficient documentation

## 2020-07-04 DIAGNOSIS — Y929 Unspecified place or not applicable: Secondary | ICD-10-CM | POA: Diagnosis not present

## 2020-07-04 DIAGNOSIS — S4992XA Unspecified injury of left shoulder and upper arm, initial encounter: Secondary | ICD-10-CM | POA: Diagnosis present

## 2020-07-04 DIAGNOSIS — E669 Obesity, unspecified: Secondary | ICD-10-CM | POA: Diagnosis not present

## 2020-07-04 DIAGNOSIS — S46812A Strain of other muscles, fascia and tendons at shoulder and upper arm level, left arm, initial encounter: Secondary | ICD-10-CM

## 2020-07-04 DIAGNOSIS — Y999 Unspecified external cause status: Secondary | ICD-10-CM | POA: Diagnosis not present

## 2020-07-04 DIAGNOSIS — R519 Headache, unspecified: Secondary | ICD-10-CM | POA: Diagnosis not present

## 2020-07-04 DIAGNOSIS — M25561 Pain in right knee: Secondary | ICD-10-CM | POA: Insufficient documentation

## 2020-07-04 DIAGNOSIS — M7918 Myalgia, other site: Secondary | ICD-10-CM | POA: Diagnosis not present

## 2020-07-04 DIAGNOSIS — Y939 Activity, unspecified: Secondary | ICD-10-CM | POA: Insufficient documentation

## 2020-07-04 DIAGNOSIS — M25562 Pain in left knee: Secondary | ICD-10-CM | POA: Insufficient documentation

## 2020-07-04 HISTORY — DX: Polycystic ovarian syndrome: E28.2
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
## 2020-07-04 NOTE — Discharge Instructions (Signed)
You will feel worse tomorrow, that is normal.  Fever.  This feel like you ache all over.  This will tend to progressively get better over the course of the week.  Take Tylenol and ibuprofen or naproxen as needed for pain.  Please return for confusion persistent vomiting or if you are having difficulty moving her arms or legs, have trouble breathing or start urinating blood.  Take 4 over the counter ibuprofen tablets 3 times a day or 2 over-the-counter naproxen tablets twice a day for pain. Also take tylenol 1000mg (2 extra strength) four times a day.
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
## 2021-02-02 ENCOUNTER — Observation Stay (HOSPITAL_BASED_OUTPATIENT_CLINIC_OR_DEPARTMENT_OTHER)
Admission: EM | Admit: 2021-02-02 | Discharge: 2021-02-04 | Disposition: A | Discharge status: Home / Self Care | Payer: Managed Care, Other (non HMO) | Attending: Surgery | Admitting: Surgery

## 2021-02-02 ENCOUNTER — Emergency Department (HOSPITAL_BASED_OUTPATIENT_CLINIC_OR_DEPARTMENT_OTHER): Payer: Managed Care, Other (non HMO)

## 2021-02-02 ENCOUNTER — Other Ambulatory Visit: Payer: Self-pay

## 2021-02-02 ENCOUNTER — Encounter (HOSPITAL_BASED_OUTPATIENT_CLINIC_OR_DEPARTMENT_OTHER): Payer: Self-pay | Admitting: Emergency Medicine

## 2021-02-02 DIAGNOSIS — Z79899 Other long term (current) drug therapy: Secondary | ICD-10-CM | POA: Diagnosis not present

## 2021-02-02 DIAGNOSIS — E282 Polycystic ovarian syndrome: Secondary | ICD-10-CM | POA: Diagnosis not present

## 2021-02-02 DIAGNOSIS — Z791 Long term (current) use of non-steroidal anti-inflammatories (NSAID): Secondary | ICD-10-CM | POA: Insufficient documentation

## 2021-02-02 DIAGNOSIS — K8020 Calculus of gallbladder without cholecystitis without obstruction: Secondary | ICD-10-CM | POA: Diagnosis present

## 2021-02-02 DIAGNOSIS — K8050 Calculus of bile duct without cholangitis or cholecystitis without obstruction: Secondary | ICD-10-CM | POA: Diagnosis present

## 2021-02-02 DIAGNOSIS — Z87442 Personal history of urinary calculi: Secondary | ICD-10-CM | POA: Diagnosis not present

## 2021-02-02 DIAGNOSIS — R1013 Epigastric pain: Secondary | ICD-10-CM | POA: Diagnosis present

## 2021-02-02 DIAGNOSIS — K812 Acute cholecystitis with chronic cholecystitis: Principal | ICD-10-CM | POA: Insufficient documentation

## 2021-02-02 DIAGNOSIS — Z20822 Contact with and (suspected) exposure to covid-19: Secondary | ICD-10-CM | POA: Insufficient documentation

## 2021-02-02 DIAGNOSIS — Z419 Encounter for procedure for purposes other than remedying health state, unspecified: Secondary | ICD-10-CM

## 2021-02-02 HISTORY — DX: Calculus of kidney: N20.0

## 2021-02-02 LAB — COMPREHENSIVE METABOLIC PANEL
ALT: 29 U/L (ref 0–44)
AST: 23 U/L (ref 15–41)
Albumin: 3.8 g/dL (ref 3.5–5.0)
Alkaline Phosphatase: 52 U/L (ref 38–126)
Anion gap: 11 (ref 5–15)
BUN: 21 mg/dL — ABNORMAL HIGH (ref 6–20)
CO2: 22 mmol/L (ref 22–32)
Calcium: 9 mg/dL (ref 8.9–10.3)
Chloride: 104 mmol/L (ref 98–111)
Creatinine, Ser: 0.8 mg/dL (ref 0.44–1.00)
GFR, Estimated: >60 mL/min (ref >60)
Glucose, Bld: 191 mg/dL — ABNORMAL HIGH (ref 70–99)
Potassium: 3.5 mmol/L (ref 3.5–5.1)
Sodium: 137 mmol/L (ref 135–145)
Total Bilirubin: 0.3 mg/dL (ref 0.3–1.2)
Total Protein: 7.2 g/dL (ref 6.5–8.1)

## 2021-02-02 LAB — CBC WITH DIFFERENTIAL/PLATELET
Abs Immature Granulocytes: 0.02 10*3/uL (ref 0.00–0.07)
Basophils Absolute: 0.1 10*3/uL (ref 0.0–0.1)
Basophils Relative: 1 %
Eosinophils Absolute: 0.2 10*3/uL (ref 0.0–0.5)
Eosinophils Relative: 2 %
HCT: 36.4 % (ref 36.0–46.0)
Hemoglobin: 12.3 g/dL (ref 12.0–15.0)
Immature Granulocytes: 0 %
Lymphocytes Relative: 34 %
Lymphs Abs: 2.9 10*3/uL (ref 0.7–4.0)
MCH: 28.1 pg (ref 26.0–34.0)
MCHC: 33.8 g/dL (ref 30.0–36.0)
MCV: 83.3 fL (ref 80.0–100.0)
Monocytes Absolute: 0.5 10*3/uL (ref 0.1–1.0)
Monocytes Relative: 6 %
Neutro Abs: 4.9 10*3/uL (ref 1.7–7.7)
Neutrophils Relative %: 57 %
Platelets: 249 10*3/uL (ref 150–400)
RBC: 4.37 MIL/uL (ref 3.87–5.11)
RDW: 13.2 % (ref 11.5–15.5)
WBC: 8.6 10*3/uL (ref 4.0–10.5)
nRBC: 0 % (ref 0.0–0.2)

## 2021-02-02 LAB — RESP PANEL BY RT-PCR (FLU A&B, COVID) ARPGX2
Influenza A by PCR: NEGATIVE
Influenza B by PCR: NEGATIVE
SARS Coronavirus 2 by RT PCR: NEGATIVE

## 2021-02-02 LAB — URINALYSIS, MICROSCOPIC (REFLEX)

## 2021-02-02 LAB — URINALYSIS, ROUTINE W REFLEX MICROSCOPIC
Bilirubin Urine: NEGATIVE
Glucose, UA: NEGATIVE mg/dL
Ketones, ur: NEGATIVE mg/dL
Leukocytes,Ua: NEGATIVE
Nitrite: NEGATIVE
Protein, ur: NEGATIVE mg/dL
Specific Gravity, Urine: 1.02 (ref 1.005–1.030)
pH: 6.5 (ref 5.0–8.0)

## 2021-02-02 LAB — PREGNANCY, URINE: Preg Test, Ur: NEGATIVE

## 2021-02-02 LAB — LIPASE, BLOOD: Lipase: 44 U/L (ref 11–51)

## 2021-02-02 IMAGING — CT CT ABD-PELV W/ CM
2 of 4 series · 16 of 46 positions shown, 18 images · IV contrast (Omnipaque)
Comparison: [DATE]

CLINICAL DATA: Abdominal pain

EXAM:
CT ABDOMEN AND PELVIS WITH CONTRAST
TECHNIQUE: Multidetector CT imaging of the abdomen and pelvis was performed
using the standard protocol following bolus administration of
intravenous contrast.
CONTRAST:  100mL OMNIPAQUE IOHEXOL 300 MG/ML  SOLN

[Series 3: axial st · axial · 0.98mm/px · z∈[+613,+1038]mm · 13 of 93 slices shown, 15 images]
[im 4/93  soft-tissue]
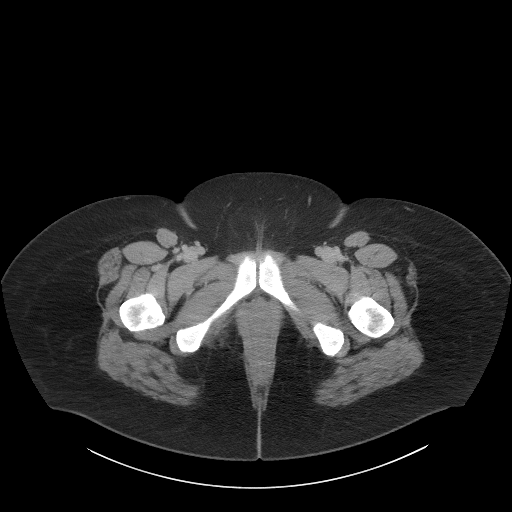
[im 4/93  bone]
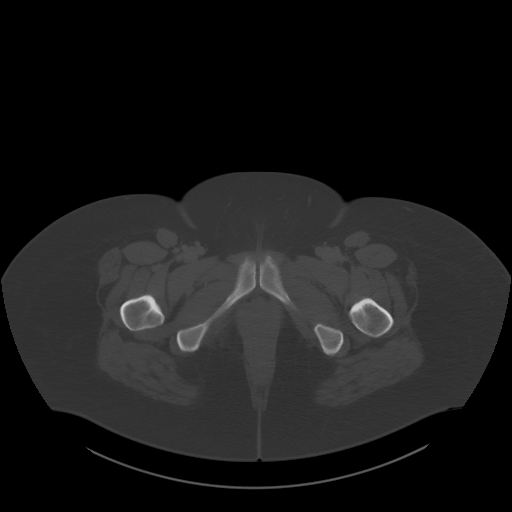
[im 12/93  soft-tissue]
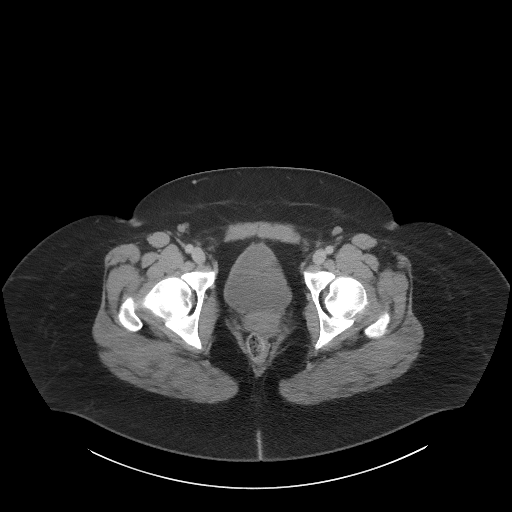
[im 19/93  soft-tissue]
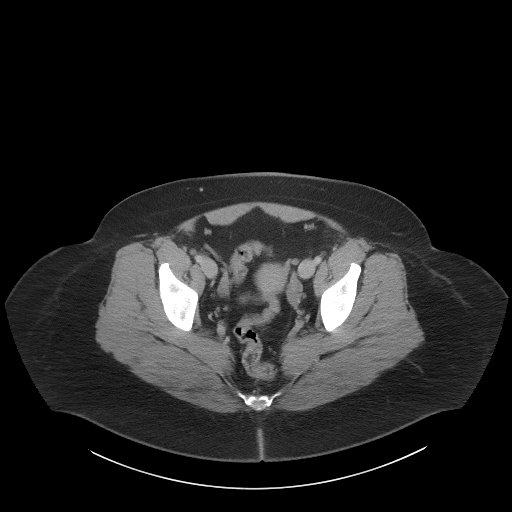
[im 26/93  soft-tissue]
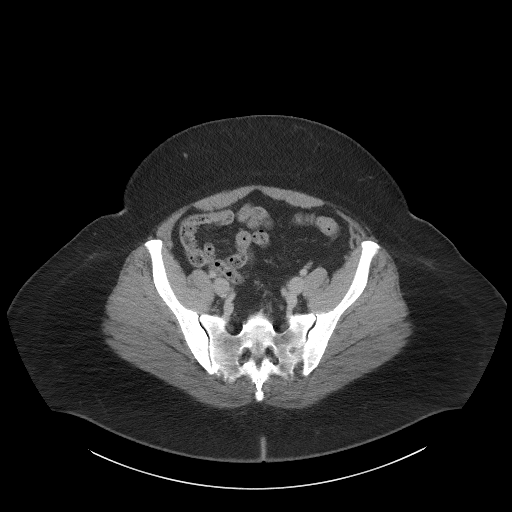
[im 34/93  soft-tissue]
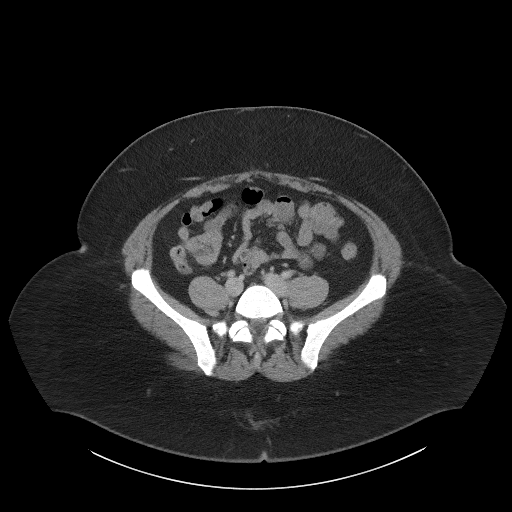
[im 41/93  soft-tissue]
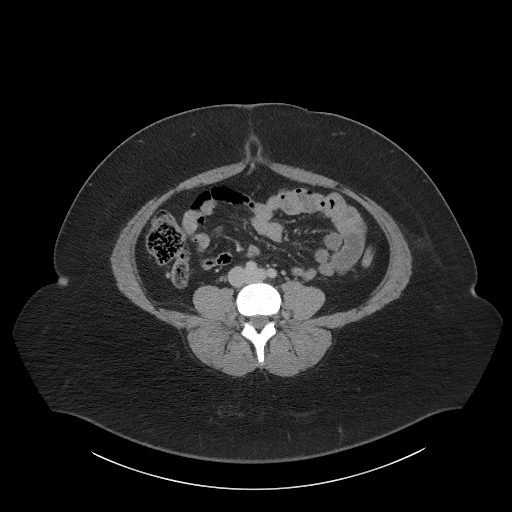
[im 48/93  soft-tissue]
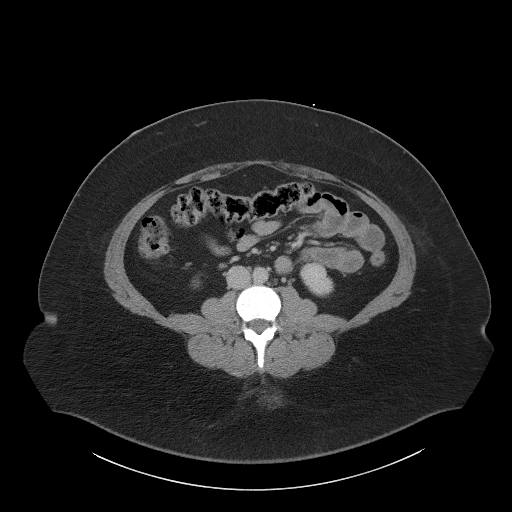
[im 52/93  soft-tissue]
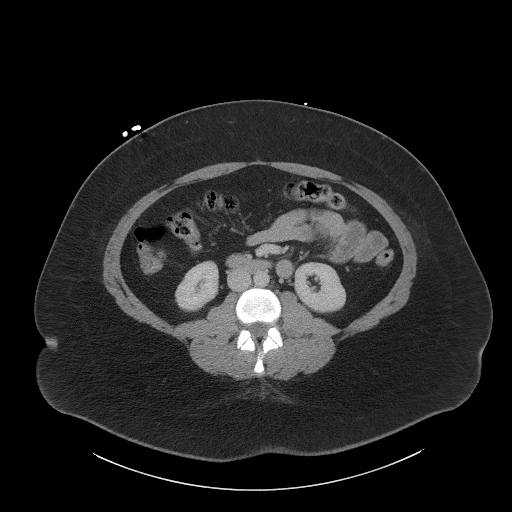
[im 59/93  soft-tissue]
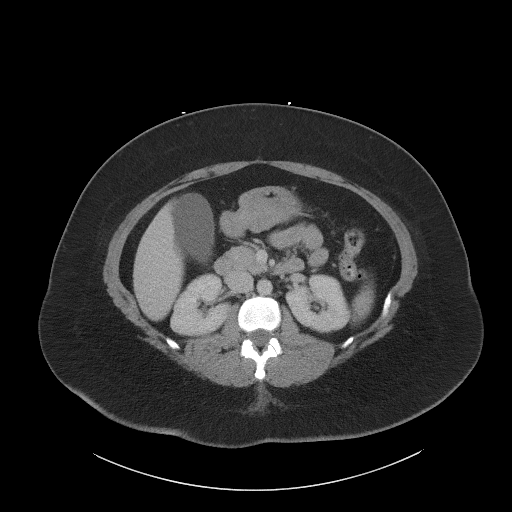
[im 59/93  bone]
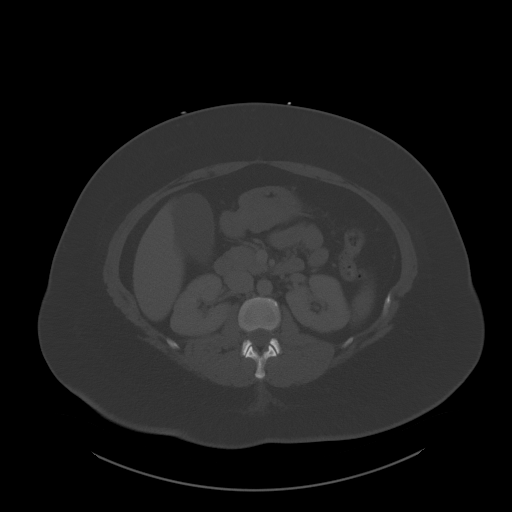
[im 67/93  soft-tissue]
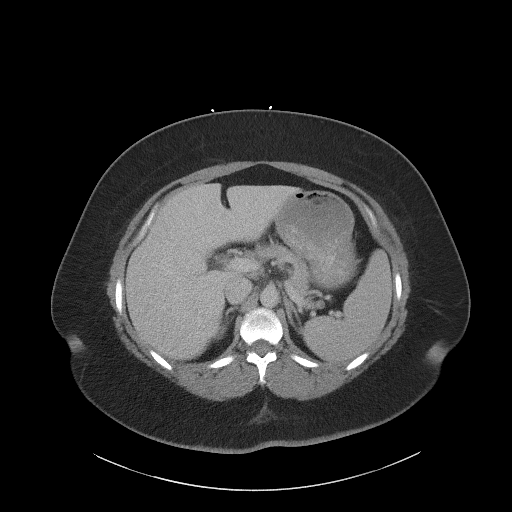
[im 74/93  soft-tissue]
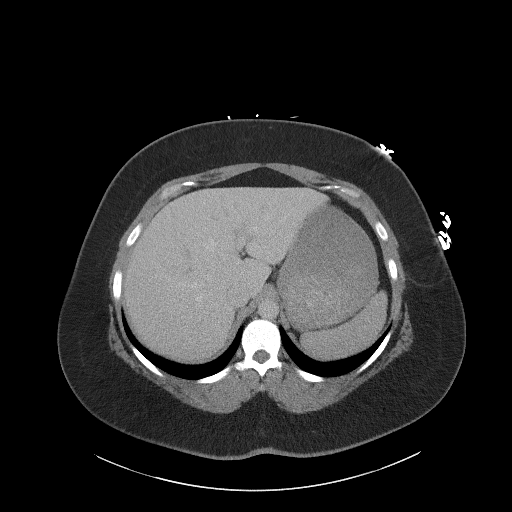
[im 81/93  soft-tissue]
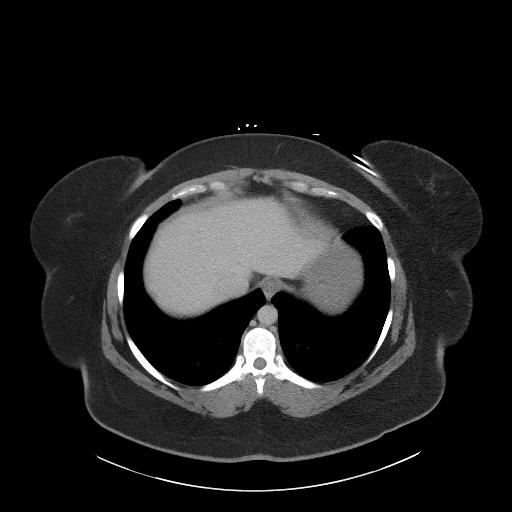
[im 89/93  soft-tissue]
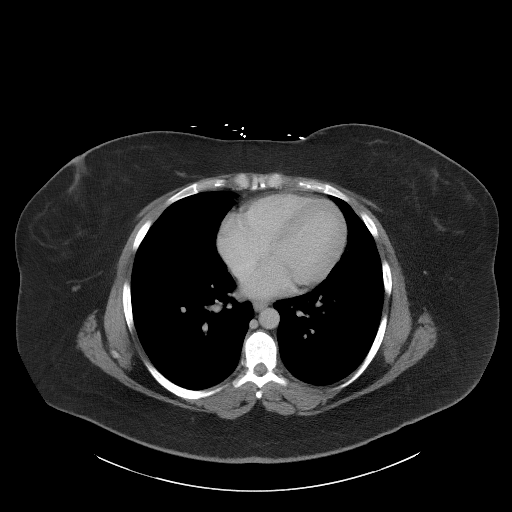

[Series 5: coronal st · coronal · 0.93mm/px · 3 of 117 slices shown]
[im 39/117  soft-tissue]
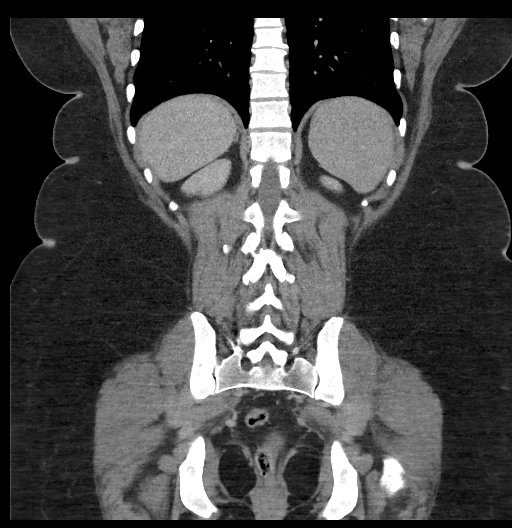
[im 52/117  soft-tissue]
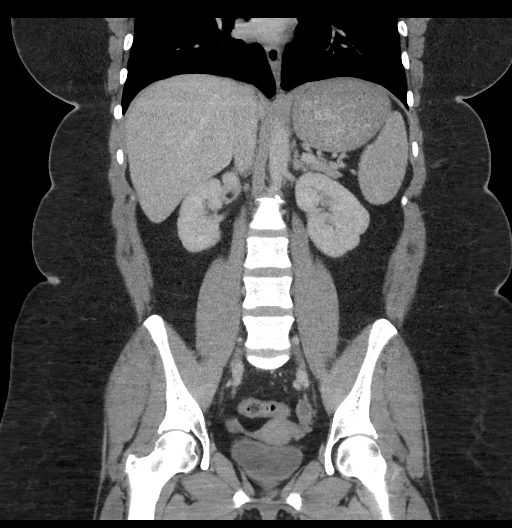
[im 65/117  soft-tissue]
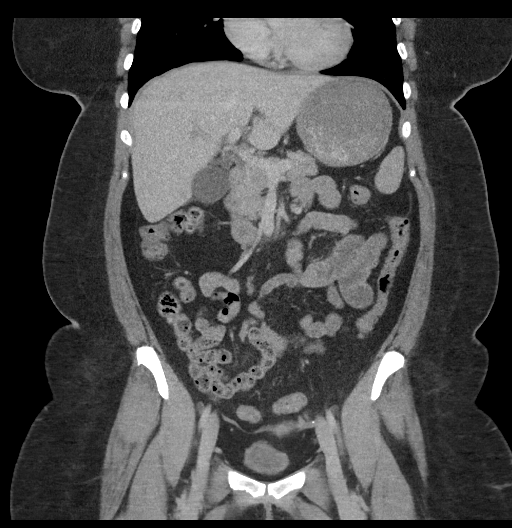

[16 of 46 positions shown; findings below may reference images not displayed]

FINDINGS: Lower chest: Lung bases are clear.

Hepatobiliary: No focal liver lesions are appreciable. The
gallbladder is mildly distended without gallbladder wall thickening.
No appreciable biliary duct dilatation.

Pancreas: There is no pancreatic mass or inflammatory focus.

Spleen: No splenic lesions are evident.

Adrenals/Urinary Tract: Adrenals bilaterally appear normal. There is
no renal mass or hydronephrosis on either side. No renal or ureteral
calculus on either side. Urinary bladder is midline with wall
thickness within normal limits.

Stomach/Bowel: Stomach is filled with fluid. Stomach wall is not
thickened. There is no appreciable bowel wall or mesenteric
thickening on this study. There are occasional sigmoid diverticula
without diverticulitis. No evident bowel obstruction. The terminal
ileum appears normal. The appendix appears normal. There is no
demonstrable free air or portal venous air.

Vascular/Lymphatic: No abdominal aortic aneurysm. No arterial
vascular lesions are evident. Major venous structures appear patent.
No evident adenopathy in the abdomen or pelvis.

Reproductive: Uterus is anteverted.  No adnexal masses evident.

Other: No ascites or abscess evident in the abdomen or pelvis. There
is fat in the umbilicus.

Musculoskeletal: No blastic or lytic bone lesions. No evident chest
wall lesions.
IMPRESSION: 1. Gallbladder mildly distended without gallbladder wall thickening.

2. Sigmoid diverticula without diverticulitis. No bowel wall
thickening or bowel obstruction. No abscess in the abdomen or
pelvis. Appendix appears normal.

3. No renal or ureteral calculus. No hydronephrosis. Urinary bladder
wall thickness within normal limits.

## 2021-02-02 IMAGING — US US ABDOMEN LIMITED RUQ/ASCITES
1 series · 14 of 25 positions shown · non-contrast
Comparison: CT abdomen and pelvis [DATE]

CLINICAL DATA: Upper abdominal pain

EXAM:
ULTRASOUND ABDOMEN LIMITED RIGHT UPPER QUADRANT

[Series 1: us abdomen limited ruq/ascites · 14 of 41 slices shown]
[im 1/41]
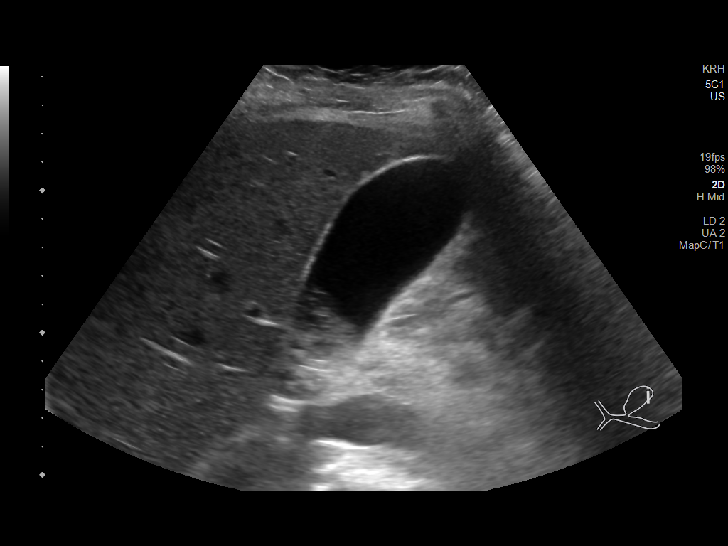
[im 4/41]
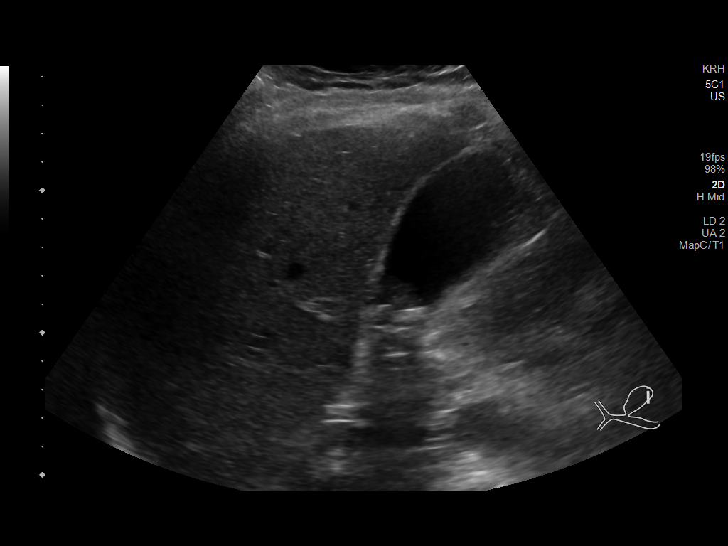
[im 7/41]
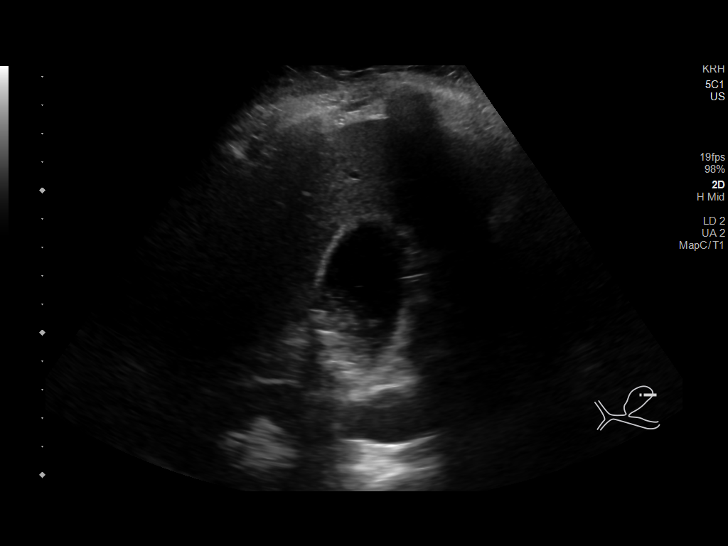
[im 11/41]
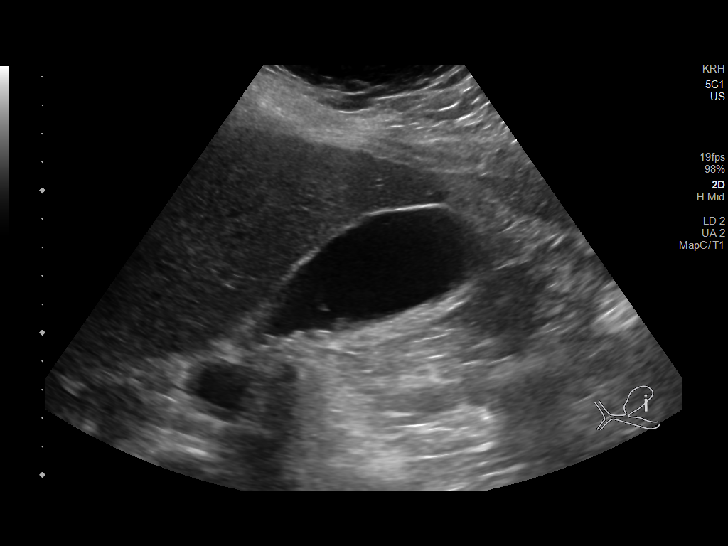
[im 14/41]
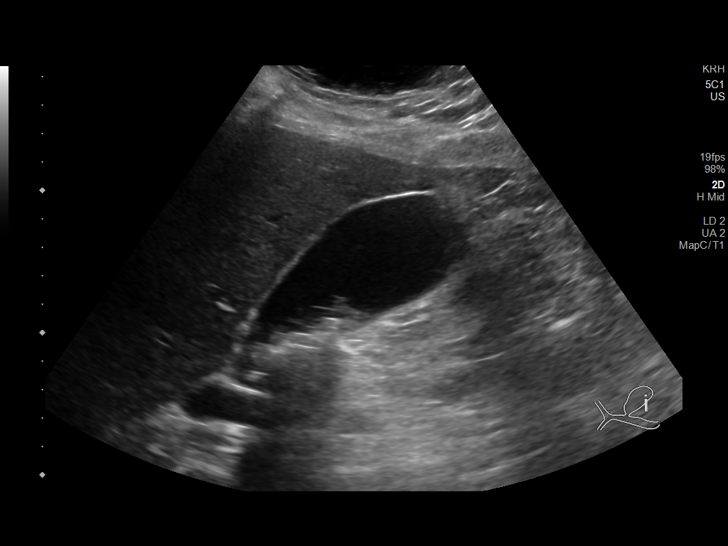
[im 16/41]
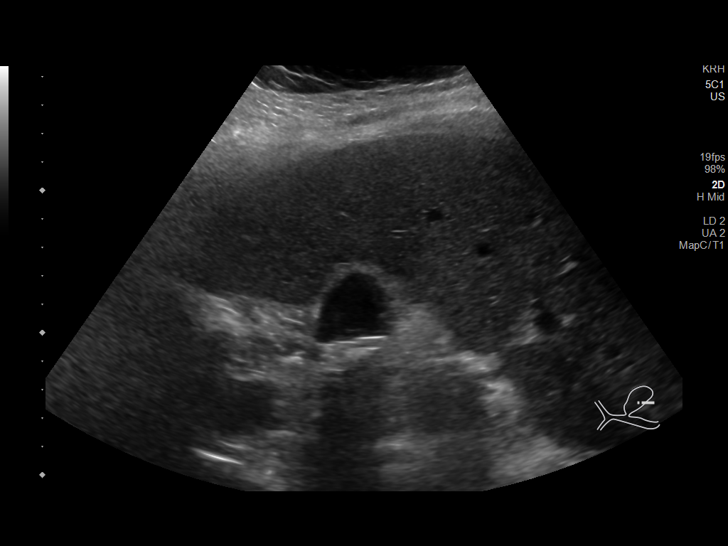
[im 19/41]
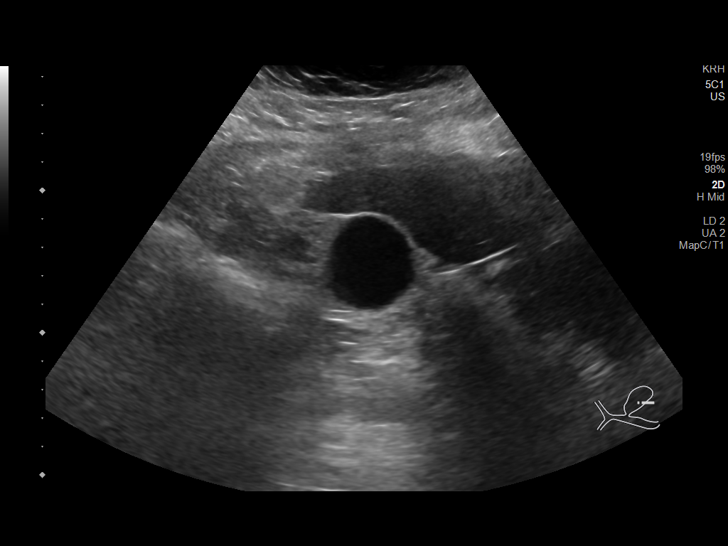
[im 22/41]
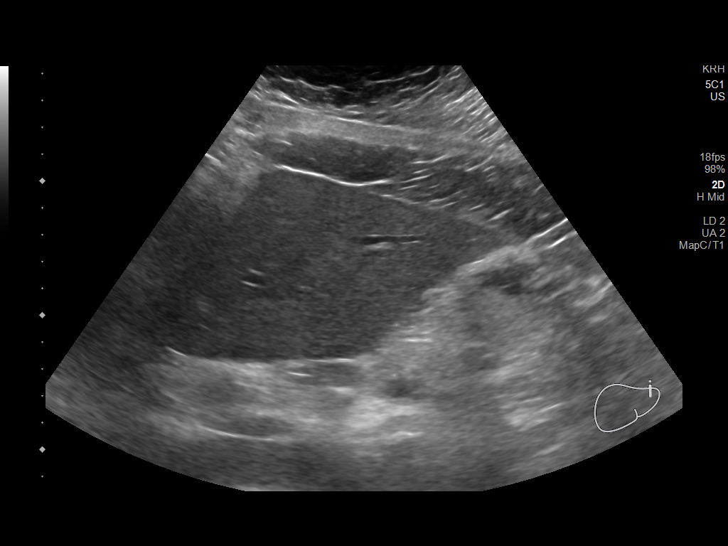
[im 26/41]
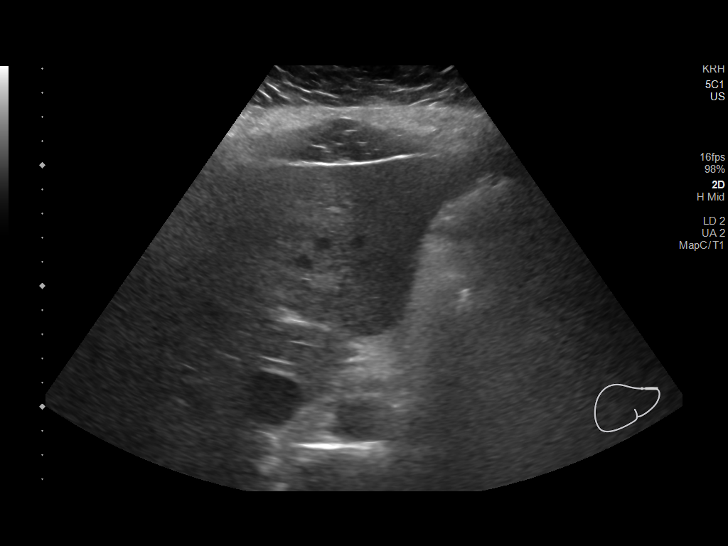
[im 27/41]
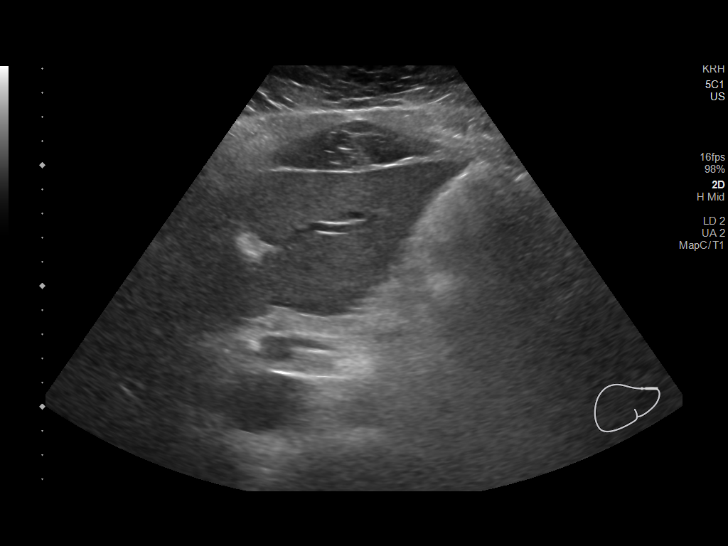
[im 31/41]
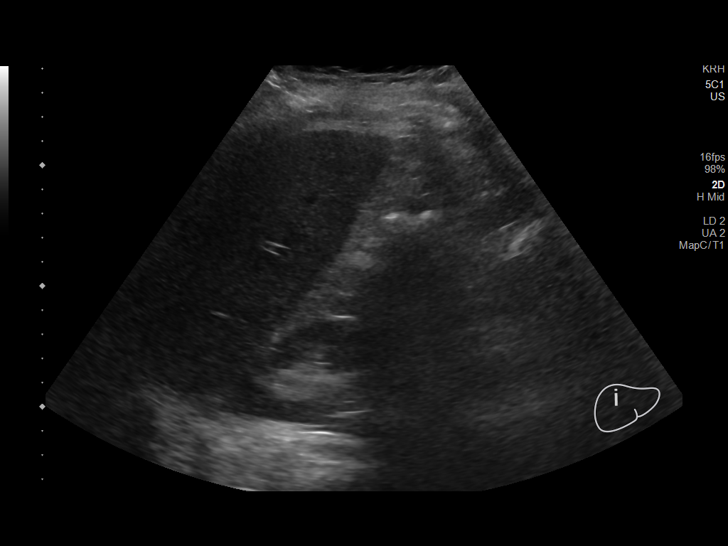
[im 34/41]
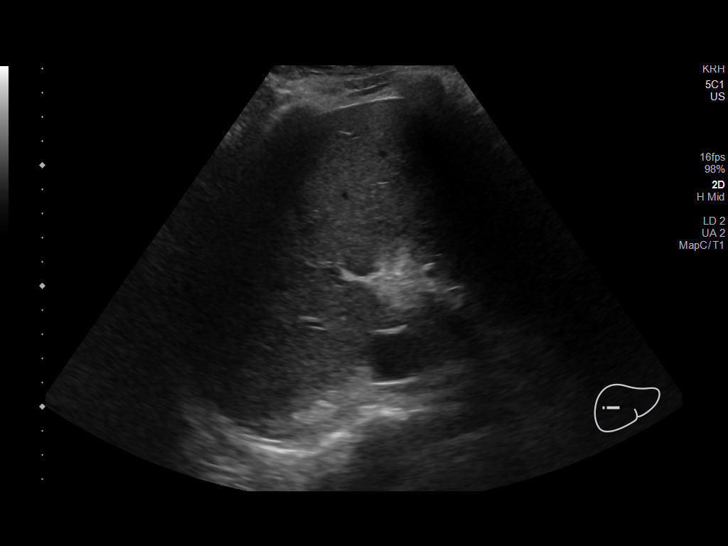
[im 37/41]
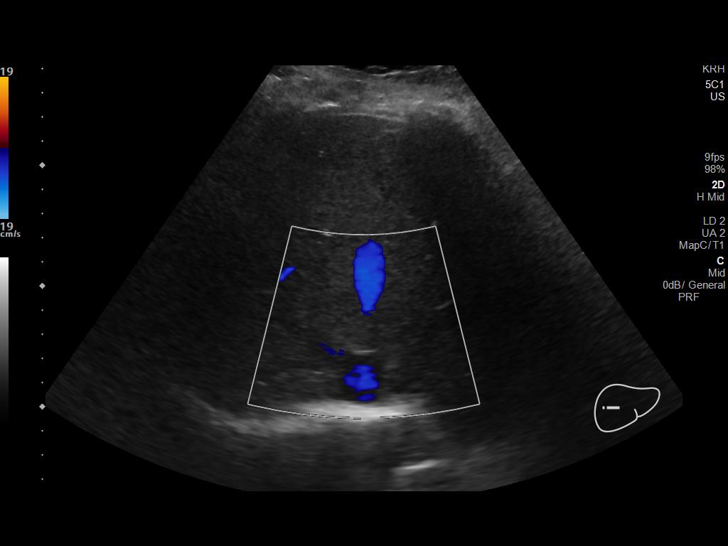
[im 41/41]
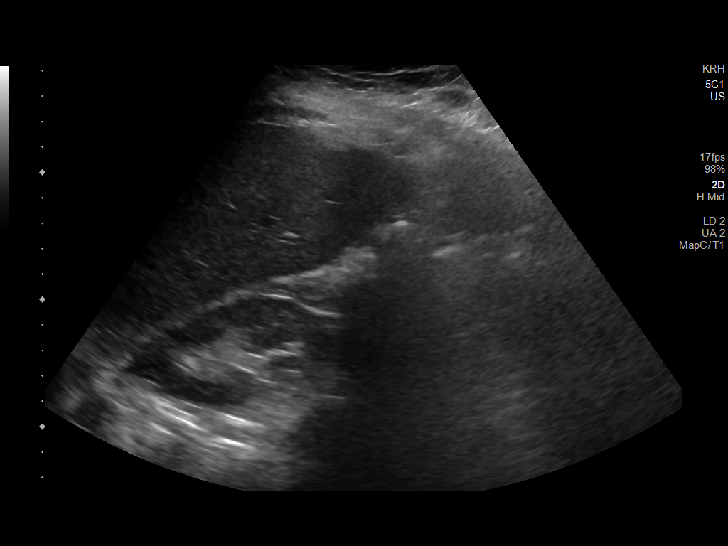

[14 of 25 positions shown; findings below may reference images not displayed]

FINDINGS: Gallbladder:

Within the gallbladder, there are scattered echogenic foci which
move and shadow consistent with cholelithiasis. Largest gallstone
measures 7 mm in length. There is also sludge in the gallbladder.
There is no appreciable gallbladder wall thickening or
pericholecystic fluid. No sonographic Murphy sign noted by
sonographer.

Common bile duct:

Diameter: 2 mm. No intrahepatic or extrahepatic biliary duct
dilatation.

Liver:

No focal lesion identified. Within normal limits in parenchymal
echogenicity. Portal vein is patent on color Doppler imaging with
normal direction of blood flow towards the liver.

Other: None.
IMPRESSION: Cholelithiasis and mild sludge in gallbladder. No gallbladder wall
thickening or pericholecystic fluid.

Study otherwise unremarkable.

## 2021-02-02 MED ORDER — LIDOCAINE VISCOUS HCL 2 % MT SOLN
15.0000 mL | Freq: Once | OROMUCOSAL | Status: AC
  Administered 2021-02-02: 15 mL via ORAL
  Filled 2021-02-02: qty 15

## 2021-02-02 MED ORDER — ONDANSETRON HCL 4 MG/2ML IJ SOLN
INTRAMUSCULAR | Status: AC
  Filled 2021-02-02: qty 2

## 2021-02-02 MED ORDER — CIPROFLOXACIN IN D5W 400 MG/200ML IV SOLN
400.0000 mg | INTRAVENOUS | Status: AC
  Administered 2021-02-03: 400 mg via INTRAVENOUS
  Filled 2021-02-02 (×2): qty 200

## 2021-02-02 MED ORDER — ONDANSETRON HCL 4 MG/2ML IJ SOLN
4.0000 mg | Freq: Once | INTRAMUSCULAR | Status: AC
  Administered 2021-02-02: 4 mg via INTRAVENOUS
  Filled 2021-02-02: qty 2

## 2021-02-02 MED ORDER — HYDROMORPHONE HCL 1 MG/ML IJ SOLN
1.0000 mg | Freq: Once | INTRAMUSCULAR | Status: AC
  Administered 2021-02-02: 1 mg via INTRAVENOUS
  Filled 2021-02-02: qty 1

## 2021-02-02 MED ORDER — ACETAMINOPHEN 500 MG PO TABS
1000.0000 mg | ORAL_TABLET | ORAL | Status: AC
  Administered 2021-02-03: 1000 mg via ORAL
  Filled 2021-02-02 (×2): qty 2

## 2021-02-02 MED ORDER — CHLORHEXIDINE GLUCONATE CLOTH 2 % EX PADS
6.0000 | MEDICATED_PAD | Freq: Once | CUTANEOUS | Status: AC
  Administered 2021-02-02: 6 via TOPICAL

## 2021-02-02 MED ORDER — METRONIDAZOLE IN NACL 5-0.79 MG/ML-% IV SOLN
500.0000 mg | INTRAVENOUS | Status: AC
  Administered 2021-02-03: 500 mg via INTRAVENOUS
  Filled 2021-02-02 (×2): qty 100

## 2021-02-02 MED ORDER — HYDROMORPHONE HCL 1 MG/ML IJ SOLN
1.0000 mg | Freq: Once | INTRAMUSCULAR | Status: AC
Start: 2021-02-02 — End: 2021-02-02
  Administered 2021-02-02: 1 mg via INTRAVENOUS
  Filled 2021-02-02: qty 1

## 2021-02-02 MED ORDER — HYDROMORPHONE HCL 1 MG/ML IJ SOLN
1.0000 mg | INTRAMUSCULAR | Status: DC | PRN
Start: 2021-02-02 — End: 2021-02-03

## 2021-02-02 MED ORDER — ALUM & MAG HYDROXIDE-SIMETH 200-200-20 MG/5ML PO SUSP
30.0000 mL | Freq: Once | ORAL | Status: AC
  Administered 2021-02-02: 30 mL via ORAL
  Filled 2021-02-02: qty 30

## 2021-02-02 MED ORDER — LACTATED RINGERS IV SOLN: INTRAVENOUS | Status: DC

## 2021-02-02 MED ORDER — ONDANSETRON HCL 4 MG/2ML IJ SOLN: 4.0000 mg | Freq: Four times a day (QID) | INTRAMUSCULAR | Status: DC | PRN

## 2021-02-02 MED ORDER — HYDROMORPHONE HCL 1 MG/ML IJ SOLN
0.5000 mg | INTRAMUSCULAR | Status: DC | PRN
  Administered 2021-02-02 – 2021-02-03 (×3): 1 mg via INTRAVENOUS
  Filled 2021-02-02 (×4): qty 1

## 2021-02-02 MED ORDER — ONDANSETRON HCL 4 MG/2ML IJ SOLN
4.0000 mg | Freq: Once | INTRAMUSCULAR | Status: AC
  Administered 2021-02-02: 4 mg via INTRAVENOUS

## 2021-02-02 MED ORDER — WHITE PETROLATUM EX OINT
TOPICAL_OINTMENT | CUTANEOUS | Status: AC
  Filled 2021-02-02: qty 28.35

## 2021-02-02 MED ORDER — CHLORHEXIDINE GLUCONATE CLOTH 2 % EX PADS
6.0000 | MEDICATED_PAD | Freq: Once | CUTANEOUS | Status: AC
  Administered 2021-02-03: 6 via TOPICAL

## 2021-02-02 MED ORDER — IOHEXOL 300 MG/ML  SOLN
100.0000 mL | Freq: Once | INTRAMUSCULAR | Status: AC | PRN
  Administered 2021-02-02: 100 mL via INTRAVENOUS

## 2021-02-02 MED ORDER — SODIUM CHLORIDE 0.9 % IV BOLUS
1000.0000 mL | Freq: Once | INTRAVENOUS | Status: AC
  Administered 2021-02-02: 1000 mL via INTRAVENOUS

## 2021-02-02 MED ORDER — ENSURE PRE-SURGERY PO LIQD
296.0000 mL | Freq: Once | ORAL | Status: AC
  Administered 2021-02-03: 296 mL via ORAL
  Filled 2021-02-02 (×2): qty 296

## 2021-02-02 NOTE — ED Triage Notes (Signed)
Epigastric pain x45 minutes. States radiates to her back. States she feels like she's going to explode.

## 2021-02-02 NOTE — ED Provider Notes (Signed)
[facility] EMERGENCY DEPARTMENT Provider Note  CSN: [id number] Arrival date & time: 02/02/21 0531    History Chief Complaint  Patient presents with  . Abdominal Pain    Epigastric pain    HPI  Jill Bass is a 35 y.o. female presents for evaluation of abdominal pain. She reports she had sudden onset of severe cramping, epigastric abdominal pain, radiating into her back. Onset was about an hour ago, she tried self-induced vomiting, gas pills and having a BM without improvement. She has had similar pains off and on for the last few weeks that were not as severe, typically lasted for a few hours and then resolved. Seems to be worse with eating. No recent diarrhea or constipation. She has not had a fever. No chest pain or SOB. She has a history of kidney stones but this pain is different. She drinks alcohol occasionally including last night.    Past Medical History:  Diagnosis Date  . Kidney stones   . Migraine   . PCOS (polycystic ovarian syndrome)     Past Surgical History:  Procedure Laterality Date  . TYMPANOSTOMY TUBE PLACEMENT      No family history on file.  Social History   Tobacco Use  . Smoking status: Never Smoker  . Smokeless tobacco: Never Used  Vaping Use  . Vaping Use: Never used  Substance Use Topics  . Alcohol use: Yes  . Drug use: No     Home Medications Prior to Admission medications   Medication Sig Start Date End Date Taking? Authorizing Provider  eletriptan (RELPAX) 40 MG tablet Take 40 mg by mouth every 2 (two) hours as needed for migraine. may repeat in 2 hours if necessary migraine    [provider]  Norgestimate-Ethinyl Estradiol Triphasic (TRI-SPRINTEC) 0.18/0.215/0.25 MG-35 MCG tablet Take 1 tablet by mouth daily.      [provider]  sertraline (ZOLOFT) 100 MG tablet Take 200 mg by mouth daily.     [provider]  simethicone (MYLICON) 125 MG chewable tablet Chew 125 mg by mouth every 6 (six)  hours as needed. gas     [provider]  topiramate (TOPAMAX) 100 MG tablet Take 100 mg by mouth daily. migraine    [provider]     Allergies    Penicillins   Review of Systems   Review of Systems A comprehensive review of systems was completed and negative except as noted in HPI.    Physical Exam BP 134/78 (BP Location: Right Arm)   Pulse 84   Temp 97.8 F (36.6 C) (Oral)   Resp 20   Ht 5\' 2"  (1.575 m)   Wt 117.9 kg   LMP 02/01/2021   SpO2 99%   BMI 47.55 kg/m   Physical Exam Vitals and nursing note reviewed.  Constitutional:      Appearance: Normal appearance.  HENT:     Head: Normocephalic and atraumatic.     Nose: Nose normal.     Mouth/Throat:     Mouth: Mucous membranes are moist.  Eyes:     Extraocular Movements: Extraocular movements intact.     Conjunctiva/sclera: Conjunctivae normal.  Cardiovascular:     Rate and Rhythm: Normal rate.  Pulmonary:     Effort: Pulmonary effort is normal.     Breath sounds: Normal breath sounds.  Abdominal:     General: Abdomen is flat.     Palpations: Abdomen is soft.     Tenderness: There is  abdominal tenderness in the epigastric area. There is guarding. Negative signs include Murphy's sign and McBurney's sign.  Musculoskeletal:        General: No swelling. Normal range of motion.     Cervical back: Neck supple.  Skin:    General: Skin is warm and dry.  Neurological:     General: No focal deficit present.     Mental Status: She is alert.  Psychiatric:        Mood and Affect: Mood normal.      ED Results / Procedures / Treatments   Labs (all labs ordered are listed, but only abnormal results are displayed) Labs Reviewed  COMPREHENSIVE METABOLIC PANEL - Abnormal; Notable for the following components:      Result Value   Glucose, Bld 191 (*)    BUN 21 (*)    All other components within normal limits  LIPASE, BLOOD  CBC WITH DIFFERENTIAL/PLATELET  PREGNANCY, URINE  URINALYSIS,  ROUTINE W REFLEX MICROSCOPIC    EKG EKG Interpretation  Date/Time:  Sunday February 02 2021 05:51:44 EDT Ventricular Rate:  86 PR Interval:  143 QRS Duration: 91 QT Interval:  396 QTC Calculation: 474 R Axis:   66 Text Interpretation: Sinus arrhythmia Ventricular premature complex No old tracing to compare Confirmed by Susy Frizzle [id number]) on 02/02/2021 5:58:45 AM    Radiology No results found.  Procedures Procedures  Medications Ordered in the ED Medications  HYDROmorphone (DILAUDID) injection 1 mg (1 mg Intravenous Given 02/02/21 0613)  ondansetron (ZOFRAN) injection 4 mg (4 mg Intravenous Given 02/02/21 0617)  sodium chloride 0.9 % bolus 1,000 mL (1,000 mLs Intravenous New Bag/Given 02/02/21 0625)     MDM Rules/Calculators/A&P MDM Patient here with severe colicky epigastric pain radiating into back. Consider bilary colic, pancreatitis, renal colic. Less likely bowel obstruction, aortic dissection/aneurysm. Will check labs, CT, give pain/nausea meds and reassess.   ED Course  I have reviewed the triage vital signs and the nursing notes.  Pertinent labs & imaging results that were available during my care of the patient were reviewed by me and considered in my medical decision making (see chart for details).  Clinical Course as of 02/02/21 0701  Sun Feb 02, 2021  0621 CBC is normal.  [CS]  0646 CMP and lipase are unremarkable.  [CS]  [id number] Care of the patient signed out to Dr. Rubin Payor at the change of shift pending CT.  [CS]    Clinical Course User Index [CS] Pollyann Savoy, MD    Final Clinical Impression(s) / ED Diagnoses Final diagnoses:  None    Rx / DC Orders ED Discharge Orders    None       Pollyann Savoy, MD 02/02/21 [id number]

## 2021-02-02 NOTE — ED Notes (Signed)
Report called to [facility] for transport. ETA 15-20 mins. Report called to [place], RN on 6N at [facility]. Patient and family updated.

## 2021-02-02 NOTE — H&P (Signed)
Jill Bass is an 35 y.o. female.   Chief Complaint: Abdominal pain HPI: Patient is a 35 year old female, otherwise healthy who comes in secondary to epigastric abdominal pain.  Patient states that pain began last night at approximately 3:30 AM.  Jill Bass states that her pain was in the epigastrium and radiated down to her umbilicus bilateral flank areas.  Jill Bass states that the pain is continued.  Jill Bass presented to the ER secondary to continued pain.  Patient denies any nausea or vomiting.  Upon evaluation the outside ER patient underwent ultrasound and CT scan.  I did review these personally.  This was significant for gallstones however no signs of cholecystitis.  Patient had continued unrelenting pain.  Patient was transferred for further surgical care.  Patient had no previous abdominal surgery.  Past Medical History:  Diagnosis Date  . Kidney stones   . Migraine   . PCOS (polycystic ovarian syndrome)     Past Surgical History:  Procedure Laterality Date  . TYMPANOSTOMY TUBE PLACEMENT      No family history on file. Social History:  reports that Jill Bass has never smoked. Jill Bass has never used smokeless tobacco. Jill Bass reports current alcohol use. Jill Bass reports that Jill Bass does not use drugs.  Allergies:  Allergies  Allergen Reactions  . Penicillins Hives    Medications Prior to Admission  Medication Sig Dispense Refill  . clobetasol ointment (TEMOVATE) 0.05 % Apply 1 application topically 2 (two) times daily.    Marland Kitchen ibuprofen (ADVIL) 200 MG tablet Take 200 mg by mouth every 6 (six) hours as needed for mild pain.    . simethicone (MYLICON) 125 MG chewable tablet Chew 125 mg by mouth every 6 (six) hours as needed. gas    . eletriptan (RELPAX) 40 MG tablet Take 40 mg by mouth every 2 (two) hours as needed for migraine. may repeat in 2 hours if necessary migraine      Results for orders placed or performed during the hospital encounter of 02/02/21 (from the past 48 hour(s))  Comprehensive metabolic  panel     Status: Abnormal   Collection Time: 02/02/21  6:14 AM  Result Value Ref Range   Sodium 137 135 - 145 mmol/L   Potassium 3.5 3.5 - 5.1 mmol/L   Chloride 104 98 - 111 mmol/L   CO2 22 22 - 32 mmol/L   Glucose, Bld 191 (H) 70 - 99 mg/dL    Comment: Glucose reference range applies only to samples taken after fasting for at least 8 hours.   BUN 21 (H) 6 - 20 mg/dL   Creatinine, Ser 6.20 0.44 - 1.00 mg/dL   Calcium 9.0 8.9 - 35.5 mg/dL   Total Protein 7.2 6.5 - 8.1 g/dL   Albumin 3.8 3.5 - 5.0 g/dL   AST 23 15 - 41 U/L   ALT 29 0 - 44 U/L   Alkaline Phosphatase 52 38 - 126 U/L   Total Bilirubin 0.3 0.3 - 1.2 mg/dL   GFR, Estimated >97 >41 mL/min    Comment: (NOTE) Calculated using the CKD-EPI Creatinine Equation (2021)    Anion gap 11 5 - 15    Comment: Performed at [facility], [address] [place], [place] [address]  Lipase, blood     Status: None   Collection Time: 02/02/21  6:14 AM  Result Value Ref Range   Lipase 44 11 - 51 U/L    Comment: Performed at [facility], [address] [place], [place] [address]  CBC with Differential     Status: None   Collection Time: 02/02/21  6:14 AM  Result Value Ref Range   WBC 8.6 4.0 - 10.5 K/uL   RBC 4.37 3.87 - 5.11 MIL/uL   Hemoglobin 12.3 12.0 - 15.0 g/dL   HCT 18.2 99.3 - 71.6 %   MCV 83.3 80.0 - 100.0 fL   MCH 28.1 26.0 - 34.0 pg   MCHC 33.8 30.0 - 36.0 g/dL   RDW 96.7 89.3 - 81.0 %   Platelets 249 150 - 400 K/uL   nRBC 0.0 0.0 - 0.2 %   Neutrophils Relative % 57 %   Neutro Abs 4.9 1.7 - 7.7 K/uL   Lymphocytes Relative 34 %   Lymphs Abs 2.9 0.7 - 4.0 K/uL   Monocytes Relative 6 %   Monocytes Absolute 0.5 0.1 - 1.0 K/uL   Eosinophils Relative 2 %   Eosinophils Absolute 0.2 0.0 - 0.5 K/uL   Basophils Relative 1 %   Basophils Absolute 0.1 0.0 - 0.1 K/uL   Immature Granulocytes 0 %   Abs Immature Granulocytes 0.02 0.00 - 0.07 K/uL    Comment: Performed at [facility], [address] [place], [place] [address]  Pregnancy, urine     Status: None   Collection Time: 02/02/21  7:30 AM  Result Value Ref Range   Preg Test, Ur NEGATIVE NEGATIVE    Comment:        THE SENSITIVITY OF THIS METHODOLOGY IS >20 mIU/mL. Performed at [facility], [address] [place], [place] [address]   Urinalysis, Routine w reflex microscopic Urine, Clean Catch     Status: Abnormal   Collection Time: 02/02/21  7:30 AM  Result Value Ref Range   Color, Urine YELLOW YELLOW   APPearance HAZY (A) CLEAR   Specific Gravity, Urine 1.020 1.005 - 1.030   pH 6.5 5.0 - 8.0   Glucose, UA NEGATIVE NEGATIVE mg/dL   Hgb urine dipstick MODERATE (A) NEGATIVE   Bilirubin Urine NEGATIVE NEGATIVE   Ketones, ur NEGATIVE NEGATIVE mg/dL   Protein, ur NEGATIVE NEGATIVE mg/dL   Nitrite NEGATIVE NEGATIVE   Leukocytes,Ua NEGATIVE NEGATIVE    Comment: Performed at [facility], [address] [place], [place] [address]  Urinalysis, Microscopic (reflex)     Status: Abnormal   Collection Time: 02/02/21  7:30 AM  Result Value Ref Range   RBC / HPF 0-5 0 - 5 RBC/hpf   WBC, UA 0-5 0 - 5 WBC/hpf   Bacteria, UA MANY (A) NONE SEEN   Squamous Epithelial / LPF 6-10 0 - 5   Mucus PRESENT     Comment: Performed at [facility], [address] [place], [place] [address]  Resp Panel by RT-PCR (Flu A&B, Covid) Nasopharyngeal Swab     Status: None   Collection Time: 02/02/21 12:41 PM   Specimen: Nasopharyngeal Swab; Nasopharyngeal(NP) swabs in vial transport medium  Result Value Ref Range   SARS Coronavirus 2 by RT PCR NEGATIVE NEGATIVE    Comment: (NOTE) SARS-CoV-2 target nucleic acids are NOT DETECTED.  The SARS-CoV-2 RNA is generally detectable in upper respiratory specimens during the acute phase of infection. The lowest concentration of SARS-CoV-2 viral copies this assay can detect is 138 copies/mL. A negative result does not preclude SARS-Cov-2 infection and should not be  used as the sole basis for treatment or other patient management decisions. A  negative result may occur with  improper specimen collection/handling, submission of specimen other than nasopharyngeal swab, presence of viral mutation(s) within the areas targeted by this assay, and inadequate number of viral copies(<138 copies/mL). A negative result must be combined with clinical observations, patient history, and epidemiological information. The expected result is Negative.  Fact Sheet for Patients:  BloggerCourse.com  Fact Sheet for Healthcare Providers:  SeriousBroker.it  This test is no t yet approved or cleared by the [place] FDA and  has been authorized for detection and/or diagnosis of SARS-CoV-2 by FDA under an Emergency Use Authorization (EUA). This EUA will remain  in effect (meaning this test can be used) for the duration of the COVID-19 declaration under Section 564(b)(1) of the Act, 21 U.S.C.section 360bbb-3(b)(1), unless the authorization is terminated  or revoked sooner.       Influenza A by PCR NEGATIVE NEGATIVE   Influenza B by PCR NEGATIVE NEGATIVE    Comment: (NOTE) The Xpert Xpress SARS-CoV-2/FLU/RSV plus assay is intended as an aid in the diagnosis of influenza from Nasopharyngeal swab specimens and should not be used as a sole basis for treatment. Nasal washings and aspirates are unacceptable for Xpert Xpress SARS-CoV-2/FLU/RSV testing.  Fact Sheet for Patients: BloggerCourse.com  Fact Sheet for Healthcare Providers: SeriousBroker.it  This test is not yet approved or cleared by the [place] FDA and has been authorized for detection and/or diagnosis of SARS-CoV-2 by FDA under an Emergency Use Authorization (EUA). This EUA will remain in effect (meaning this test can be used) for the duration of the COVID-19 declaration under Section 564(b)(1) of the  Act, 21 U.S.C. section 360bbb-3(b)(1), unless the authorization is terminated or revoked.  Performed at [facility], [address] [place], [place] [address]    CT Abdomen Pelvis W Contrast  Result Date: 02/02/2021 CLINICAL DATA:  Abdominal pain EXAM: CT ABDOMEN AND PELVIS WITH CONTRAST TECHNIQUE: Multidetector CT imaging of the abdomen and pelvis was performed using the standard protocol following bolus administration of intravenous contrast. CONTRAST:  OMNIPAQUE IOHEXOL 300 MG/ML  SOLN COMPARISON:  October 05, 2011 FINDINGS: Lower chest: Lung bases are clear. Hepatobiliary: No focal liver lesions are appreciable. The gallbladder is mildly distended without gallbladder wall thickening. No appreciable biliary duct dilatation. Pancreas: There is no pancreatic mass or inflammatory focus. Spleen: No splenic lesions are evident. Adrenals/Urinary Tract: Adrenals bilaterally appear normal. There is no renal mass or hydronephrosis on either side. No renal or ureteral calculus on either side. Urinary bladder is midline with wall thickness within normal limits. Stomach/Bowel: Stomach is filled with fluid. Stomach wall is not thickened. There is no appreciable bowel wall or mesenteric thickening on this study. There are occasional sigmoid diverticula without diverticulitis. No evident bowel obstruction. The terminal ileum appears normal. The appendix appears normal. There is no demonstrable free air or portal venous air. Vascular/Lymphatic: No abdominal aortic aneurysm. No arterial vascular lesions are evident. Major venous structures appear patent. No evident adenopathy in the abdomen or pelvis. Reproductive: Uterus is anteverted.  No adnexal masses evident. Other: No ascites or abscess evident in the abdomen or pelvis. There is fat in the umbilicus. Musculoskeletal: No blastic or lytic bone lesions. No evident chest wall lesions. IMPRESSION: 1. Gallbladder mildly distended without gallbladder  wall thickening. 2. Sigmoid diverticula without diverticulitis. No bowel wall thickening or bowel obstruction. No abscess in the abdomen or pelvis. Appendix appears normal. 3. No renal or ureteral calculus. No hydronephrosis. Urinary bladder wall thickness within  normal limits. Electronically Signed   By: Bretta BangWilliam  Woodruff III M.D.   On: 02/02/2021 09:10   US Abdomen Limited RUQ (LIVER/GB)  Result Date: 02/02/2021 CLINICAL DATA:  Upper abdominal pain EXAM: ULTRASOUND ABDOMEN LIMITED RIGHT UPPER QUADRANT COMPARISON:  CT abdomen and pelvis February 02, 2021 FINDINGS: Gallbladder: Within the gallbladder, there are scattered echogenic foci which move and shadow consistent with cholelithiasis. Largest gallstone measures 7 mm in length. There is also sludge in the gallbladder. There is no appreciable gallbladder wall thickening or pericholecystic fluid. No sonographic Murphy sign noted by sonographer. Common bile duct: Diameter: 2 mm. No intrahepatic or extrahepatic biliary duct dilatation. Liver: No focal lesion identified. Within normal limits in parenchymal echogenicity. Portal vein is patent on color Doppler imaging with normal direction of blood flow towards the liver. Other: None. IMPRESSION: Cholelithiasis and mild sludge in gallbladder. No gallbladder wall thickening or pericholecystic fluid. Study otherwise unremarkable. Electronically Signed   By: Bretta BangWilliam  Woodruff III M.D.   On: 02/02/2021 11:23    Review of Systems  Constitutional: Negative for chills and fever.  HENT: Negative for ear discharge, hearing loss and sore throat.   Eyes: Negative for discharge.  Respiratory: Negative for cough and shortness of breath.   Cardiovascular: Negative for chest pain and leg swelling.  Gastrointestinal: Positive for abdominal pain and nausea. Negative for constipation, diarrhea and vomiting.  Musculoskeletal: Negative for myalgias and neck pain.  Skin: Negative for rash.  Allergic/Immunologic: Negative for  environmental allergies.  Neurological: Negative for dizziness and seizures.  Hematological: Does not bruise/bleed easily.  Psychiatric/Behavioral: Negative for suicidal ideas.  All other systems reviewed and are negative.   Blood pressure (!) 141/82, pulse 66, temperature (!) 97.5 F (36.4 C), resp. rate 17, height 5\' 2"  (1.575 m), weight 117.9 kg, last menstrual period 02/01/2021, SpO2 95 %. Physical Exam Constitutional:      Appearance: Jill Bass is well-developed.     Comments: Conversant No acute distress  Eyes:     General: Lids are normal. No scleral icterus.    Comments: Pupils are equal round and reactive No lid lag Moist conjunctiva  Neck:     Thyroid: No thyromegaly.     Trachea: No tracheal tenderness.     Comments: No cervical lymphadenopathy Cardiovascular:     Rate and Rhythm: Normal rate and regular rhythm.     Heart sounds: No murmur heard.   Pulmonary:     Effort: Pulmonary effort is normal.     Breath sounds: Normal breath sounds. No wheezing or rales.  Abdominal:     Tenderness: There is abdominal tenderness in the right upper quadrant. There is no guarding or rebound.     Hernia: No hernia is present.  Skin:    General: Skin is warm.     Findings: No rash.     Nails: There is no clubbing.     Comments: Normal skin turgor  Neurological:     Mental Status: Jill Bass is alert and oriented to person, place, and time.     Comments: Normal gait and station  Psychiatric:        Judgment: Judgment normal.     Comments: Appropriate affect      Assessment/Plan 35 year old female with likely subacute cholecystitis 1.  Continue n.p.o. 2.  IV antibiotics 3.  We will plan on laparoscopic cholecystectomy tomorrow by Dr. Corliss Skainssuei. All risks and benefits were discussed with the patient to generally include: infection, bleeding, possible need for post op ERCP, damage to  the bile ducts, and bile leak. Alternatives were offered and described.  All questions were answered and the  patient voiced understanding of the procedure and wishes to proceed at this point with a laparoscopic cholecystectomy   Axel Filler, MD 02/02/2021, 5:09 PM

## 2021-02-02 NOTE — Plan of Care (Signed)
Patient received from [facility]. Patient ambulated to restroom and felt nauseous while in there, states she feels sick when she moves and pain intensifies.  No orders placed as of yet, called [facility] and spoke with PA Derrell Lolling and got pain medication ordered for her.

## 2021-02-02 NOTE — ED Notes (Signed)
Medication given.

## 2021-02-02 NOTE — Progress Notes (Signed)
[facility] call placed for orders.

## 2021-02-02 NOTE — ED Provider Notes (Signed)
  Physical Exam  BP (!) 141/107   Pulse 68   Temp 97.8 F (36.6 C) (Oral)   Resp 19   Ht 5\' 2"  (1.575 m)   Wt 117.9 kg   LMP 02/01/2021   SpO2 95%   BMI 47.55 kg/m   Physical Exam  ED Course/Procedures   Clinical Course as of 02/02/21 1220  Sun Feb 02, 2021  0621 CBC is normal.  [CS]  0646 CMP and lipase are unremarkable.  [CS]  [id number] Care of the patient signed out to Dr. [id number] at the change of shift pending CT.  [CS]    Clinical Course User Index [CS] Rubin Payor, MD    Procedures  MDM  Received patient signout.  Abdominal pain.  Has had pain on and off.  Now more constant.  Has CT scan done that showed mildly distended gallbladder without pericholecystic fluid or gallbladder wall thickening.  No other cause of pain found.  Labs reassuring.  However with continued pain ultrasound done and showed gallstones and sludge.  Still no wall thickening or fluid.  Continued pain however.  Discussed with Dr. Pollyann Savoy from general surgery.  She reviewed work-up and patient will be admitted to hospital.  Covid test added.      Bedelia Person, MD 02/02/21 [id number]

## 2021-02-03 ENCOUNTER — Observation Stay (HOSPITAL_COMMUNITY): Payer: Managed Care, Other (non HMO) | Admitting: Anesthesiology

## 2021-02-03 ENCOUNTER — Encounter (HOSPITAL_COMMUNITY): Payer: Self-pay | Admitting: General Surgery

## 2021-02-03 ENCOUNTER — Observation Stay (HOSPITAL_COMMUNITY): Payer: Managed Care, Other (non HMO)

## 2021-02-03 ENCOUNTER — Encounter (HOSPITAL_COMMUNITY): Admission: EM | Disposition: A | Discharge status: Home / Self Care | Payer: Self-pay | Source: Home / Self Care

## 2021-02-03 HISTORY — PX: CHOLECYSTECTOMY: SHX55

## 2021-02-03 LAB — SURGICAL PCR SCREEN
MRSA, PCR: NEGATIVE
Staphylococcus aureus: NEGATIVE

## 2021-02-03 LAB — COMPREHENSIVE METABOLIC PANEL
ALT: 34 U/L (ref 0–44)
AST: 27 U/L (ref 15–41)
Albumin: 3.4 g/dL — ABNORMAL LOW (ref 3.5–5.0)
Alkaline Phosphatase: 53 U/L (ref 38–126)
Anion gap: 4 — ABNORMAL LOW (ref 5–15)
BUN: 13 mg/dL (ref 6–20)
CO2: 29 mmol/L (ref 22–32)
Calcium: 9 mg/dL (ref 8.9–10.3)
Chloride: 105 mmol/L (ref 98–111)
Creatinine, Ser: 0.82 mg/dL (ref 0.44–1.00)
GFR, Estimated: >60 mL/min (ref >60)
Glucose, Bld: 120 mg/dL — ABNORMAL HIGH (ref 70–99)
Potassium: 3.6 mmol/L (ref 3.5–5.1)
Sodium: 138 mmol/L (ref 135–145)
Total Bilirubin: 0.4 mg/dL (ref 0.3–1.2)
Total Protein: 6.7 g/dL (ref 6.5–8.1)

## 2021-02-03 IMAGING — RF DG CHOLANGIOGRAM OPERATIVE
1 series · 1 of 1 positions shown · non-contrast
Comparison: Abdominal ultrasound [DATE]

CLINICAL DATA: Laparoscopic cholecystectomy with intraoperative
cholangiogram

EXAM:
INTRAOPERATIVE CHOLANGIOGRAM
TECHNIQUE: Cholangiographic images from the C-arm fluoroscopic device were
submitted for interpretation post-operatively. Please see the
procedural report for the amount of contrast and the fluoroscopy
time utilized.

[Series 1: unknown protocol · 0.20mm/px · 1 of 1 slices shown]
[im 1/1]
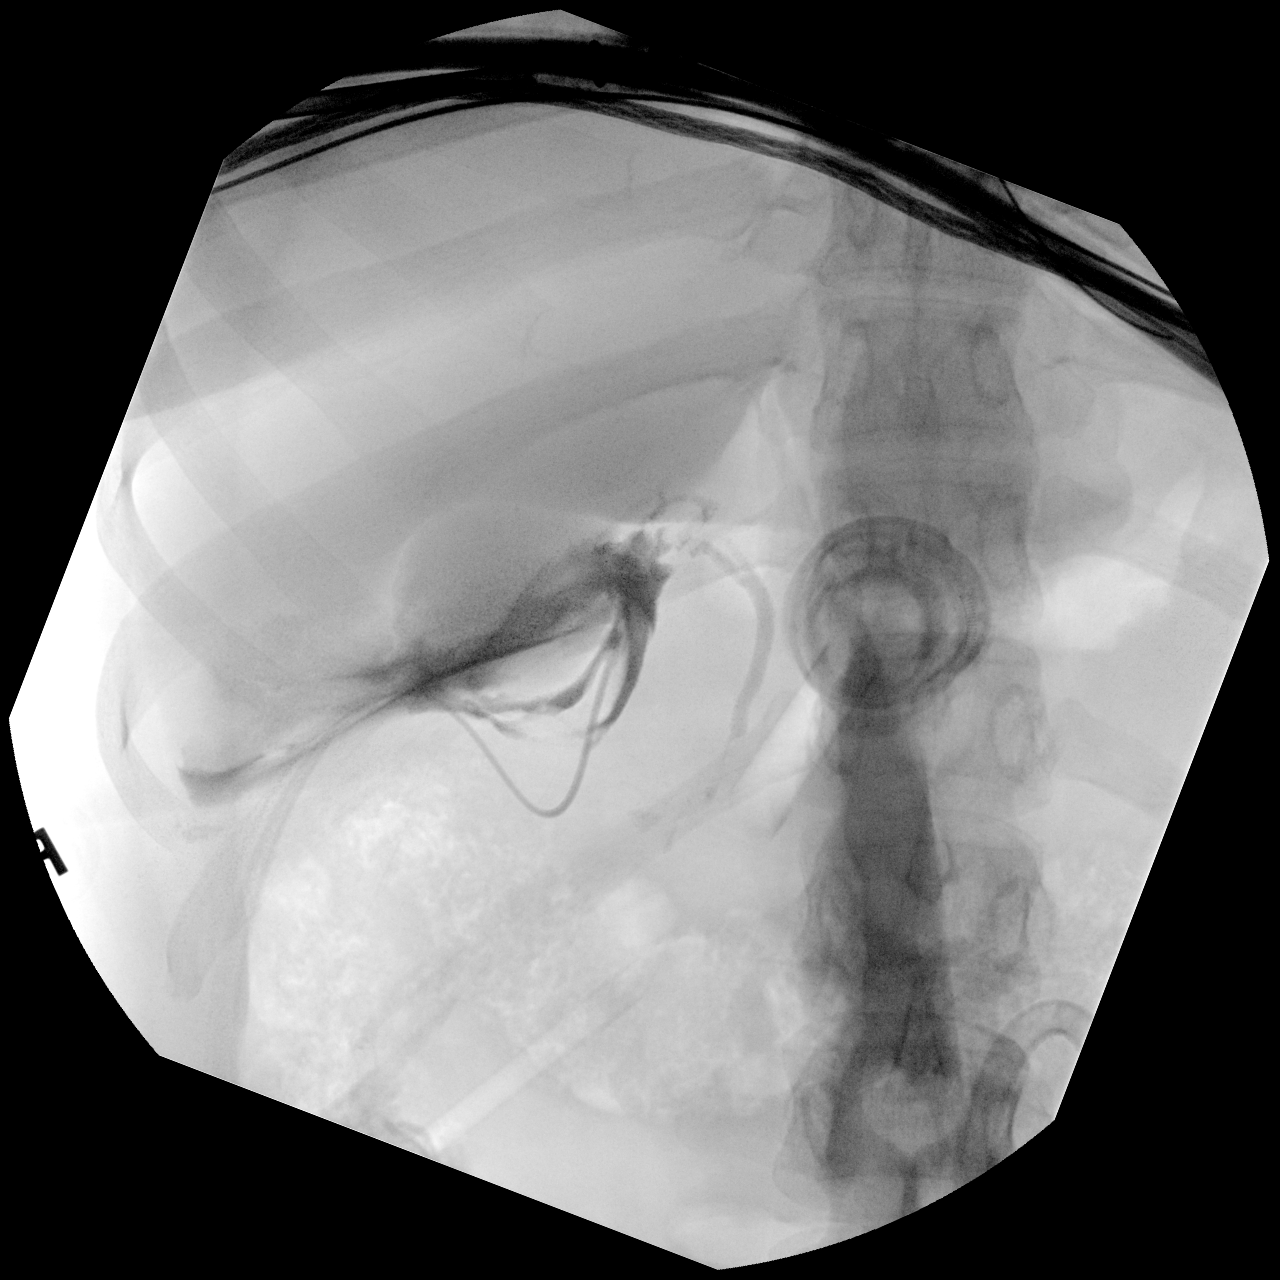

[1 of 1 positions shown; findings below may reference images not displayed]

FINDINGS: A single saved image demonstrates cannulation of the cystic duct
remanent with intraoperative cholangiogram. No evidence of biliary
ductal dilatation, stenosis or stricture. No filling defect to
suggest choledocholithiasis. Contrast material has passed through
the ampulla and into the duodenum. Extravasation of contrast
material was also present within the subhepatic space.
IMPRESSION: No evidence of choledocholithiasis.

## 2021-02-03 SURGERY — LAPAROSCOPIC CHOLECYSTECTOMY WITH INTRAOPERATIVE CHOLANGIOGRAM
Anesthesia: General | Site: Abdomen

## 2021-02-03 MED ORDER — DEXAMETHASONE SODIUM PHOSPHATE 10 MG/ML IJ SOLN
INTRAMUSCULAR | Status: DC | PRN
  Administered 2021-02-03: 10 mg via INTRAVENOUS

## 2021-02-03 MED ORDER — AMISULPRIDE (ANTIEMETIC) 5 MG/2ML IV SOLN: 10.0000 mg | Freq: Once | INTRAVENOUS | Status: DC | PRN

## 2021-02-03 MED ORDER — BUPIVACAINE HCL (PF) 0.25 % IJ SOLN
INTRAMUSCULAR | Status: AC
  Filled 2021-02-03: qty 30

## 2021-02-03 MED ORDER — HYDROMORPHONE HCL 1 MG/ML IJ SOLN
0.5000 mg | INTRAMUSCULAR | Status: DC | PRN
  Administered 2021-02-03 (×2): 1 mg via INTRAVENOUS
  Filled 2021-02-03 (×2): qty 1

## 2021-02-03 MED ORDER — SODIUM CHLORIDE 0.9 % IV SOLN
INTRAVENOUS | Status: DC | PRN
  Administered 2021-02-03: 10 mL

## 2021-02-03 MED ORDER — ACETAMINOPHEN 500 MG PO TABS
1000.0000 mg | ORAL_TABLET | Freq: Three times a day (TID) | ORAL | Status: DC
  Administered 2021-02-03 – 2021-02-04 (×2): 1000 mg via ORAL
  Filled 2021-02-03 (×4): qty 2

## 2021-02-03 MED ORDER — MIDAZOLAM HCL 2 MG/2ML IJ SOLN
INTRAMUSCULAR | Status: AC
  Filled 2021-02-03: qty 2

## 2021-02-03 MED ORDER — PROPOFOL 10 MG/ML IV BOLUS
INTRAVENOUS | Status: AC
  Filled 2021-02-03: qty 20

## 2021-02-03 MED ORDER — OXYCODONE HCL 5 MG/5ML PO SOLN: 5.0000 mg | Freq: Once | ORAL | Status: DC | PRN

## 2021-02-03 MED ORDER — CHLORHEXIDINE GLUCONATE 0.12 % MT SOLN: 15.0000 mL | Freq: Once | OROMUCOSAL | Status: AC

## 2021-02-03 MED ORDER — PROPOFOL 10 MG/ML IV BOLUS
INTRAVENOUS | Status: DC | PRN
  Administered 2021-02-03: 160 mg via INTRAVENOUS

## 2021-02-03 MED ORDER — BUPIVACAINE HCL (PF) 0.25 % IJ SOLN
INTRAMUSCULAR | Status: DC | PRN
  Administered 2021-02-03: 20 mL

## 2021-02-03 MED ORDER — FENTANYL CITRATE (PF) 100 MCG/2ML IJ SOLN
INTRAMUSCULAR | Status: AC
  Filled 2021-02-03: qty 2

## 2021-02-03 MED ORDER — MIDAZOLAM HCL 2 MG/2ML IJ SOLN
INTRAMUSCULAR | Status: DC | PRN
  Administered 2021-02-03: 2 mg via INTRAVENOUS

## 2021-02-03 MED ORDER — LACTATED RINGERS IV SOLN: INTRAVENOUS | Status: DC

## 2021-02-03 MED ORDER — ROCURONIUM BROMIDE 10 MG/ML (PF) SYRINGE
PREFILLED_SYRINGE | INTRAVENOUS | Status: DC | PRN
  Administered 2021-02-03: 50 mg via INTRAVENOUS

## 2021-02-03 MED ORDER — LIDOCAINE 2% (20 MG/ML) 5 ML SYRINGE
INTRAMUSCULAR | Status: DC | PRN
  Administered 2021-02-03: 60 mg via INTRAVENOUS

## 2021-02-03 MED ORDER — CHLORHEXIDINE GLUCONATE 0.12 % MT SOLN
OROMUCOSAL | Status: AC
  Administered 2021-02-03: 15 mL via OROMUCOSAL
  Filled 2021-02-03: qty 15

## 2021-02-03 MED ORDER — FENTANYL CITRATE (PF) 100 MCG/2ML IJ SOLN
25.0000 ug | INTRAMUSCULAR | Status: DC | PRN
  Administered 2021-02-03 (×3): 25 ug via INTRAVENOUS

## 2021-02-03 MED ORDER — FENTANYL CITRATE (PF) 250 MCG/5ML IJ SOLN
INTRAMUSCULAR | Status: AC
  Filled 2021-02-03: qty 5

## 2021-02-03 MED ORDER — SUCCINYLCHOLINE CHLORIDE 200 MG/10ML IV SOSY
PREFILLED_SYRINGE | INTRAVENOUS | Status: DC | PRN
  Administered 2021-02-03: 140 mg via INTRAVENOUS

## 2021-02-03 MED ORDER — SUGAMMADEX SODIUM 200 MG/2ML IV SOLN
INTRAVENOUS | Status: DC | PRN
  Administered 2021-02-03: 300 mg via INTRAVENOUS

## 2021-02-03 MED ORDER — ONDANSETRON HCL 4 MG/2ML IJ SOLN: 4.0000 mg | Freq: Once | INTRAMUSCULAR | Status: DC | PRN

## 2021-02-03 MED ORDER — SODIUM CHLORIDE 0.9 % IR SOLN
Status: DC | PRN
  Administered 2021-02-03: 1000 mL

## 2021-02-03 MED ORDER — PHENYLEPHRINE 40 MCG/ML (10ML) SYRINGE FOR IV PUSH (FOR BLOOD PRESSURE SUPPORT)
PREFILLED_SYRINGE | INTRAVENOUS | Status: DC | PRN
  Administered 2021-02-03: 80 ug via INTRAVENOUS
  Administered 2021-02-03 (×2): 120 ug via INTRAVENOUS
  Administered 2021-02-03: 80 ug via INTRAVENOUS

## 2021-02-03 MED ORDER — CIPROFLOXACIN IN D5W 400 MG/200ML IV SOLN
400.0000 mg | Freq: Two times a day (BID) | INTRAVENOUS | Status: AC
  Administered 2021-02-03 – 2021-02-04 (×2): 400 mg via INTRAVENOUS
  Filled 2021-02-03 (×2): qty 200

## 2021-02-03 MED ORDER — OXYCODONE HCL 5 MG PO TABS: 5.0000 mg | ORAL_TABLET | Freq: Once | ORAL | Status: DC | PRN

## 2021-02-03 MED ORDER — ORAL CARE MOUTH RINSE: 15.0000 mL | Freq: Once | OROMUCOSAL | Status: AC

## 2021-02-03 MED ORDER — METRONIDAZOLE IN NACL 5-0.79 MG/ML-% IV SOLN
500.0000 mg | Freq: Three times a day (TID) | INTRAVENOUS | Status: AC
  Administered 2021-02-03 – 2021-02-04 (×3): 500 mg via INTRAVENOUS
  Filled 2021-02-03 (×3): qty 100

## 2021-02-03 MED ORDER — FENTANYL CITRATE (PF) 250 MCG/5ML IJ SOLN
INTRAMUSCULAR | Status: DC | PRN
  Administered 2021-02-03: 50 ug via INTRAVENOUS
  Administered 2021-02-03: 100 ug via INTRAVENOUS
  Administered 2021-02-03 (×2): 50 ug via INTRAVENOUS

## 2021-02-03 MED ORDER — CALCIUM CARBONATE ANTACID 500 MG PO CHEW
1.0000 | CHEWABLE_TABLET | Freq: Four times a day (QID) | ORAL | Status: DC | PRN
  Administered 2021-02-03: 200 mg via ORAL
  Filled 2021-02-03: qty 1

## 2021-02-03 MED ORDER — 0.9 % SODIUM CHLORIDE (POUR BTL) OPTIME
TOPICAL | Status: DC | PRN
  Administered 2021-02-03: 1000 mL

## 2021-02-03 SURGICAL SUPPLY — 52 items
APL PRP STRL LF DISP 70% ISPRP (MISCELLANEOUS) ×1
APL SKNCLS STERI-STRIP NONHPOA (GAUZE/BANDAGES/DRESSINGS) ×1
APPLIER CLIP ROT 10 11.4 M/L (STAPLE) ×2
APR CLP MED LRG 11.4X10 (STAPLE) ×1
BAG SPEC RTRVL 10 TROC 200 (ENDOMECHANICALS) ×1
BAG SPEC RTRVL LRG 6X4 10 (ENDOMECHANICALS)
BENZOIN TINCTURE PRP APPL 2/3 (GAUZE/BANDAGES/DRESSINGS) ×2 IMPLANT
BLADE CLIPPER SURG (BLADE) IMPLANT
CANISTER SUCT 3000ML PPV (MISCELLANEOUS) ×2 IMPLANT
CHLORAPREP W/TINT 26 (MISCELLANEOUS) ×2 IMPLANT
CLIP APPLIE ROT 10 11.4 M/L (STAPLE) ×1 IMPLANT
CLSR STERI-STRIP ANTIMIC 1/2X4 (GAUZE/BANDAGES/DRESSINGS) ×1 IMPLANT
COVER MAYO STAND STRL (DRAPES) ×2 IMPLANT
COVER SURGICAL LIGHT HANDLE (MISCELLANEOUS) ×2 IMPLANT
COVER WAND RF STERILE (DRAPES) ×1 IMPLANT
DRAPE C-ARM 42X120 X-RAY (DRAPES) ×2 IMPLANT
DRSG TEGADERM 2-3/8X2-3/4 SM (GAUZE/BANDAGES/DRESSINGS) ×4 IMPLANT
DRSG TEGADERM 4X4.75 (GAUZE/BANDAGES/DRESSINGS) ×2 IMPLANT
ELECT REM PT RETURN 9FT ADLT (ELECTROSURGICAL) ×2
ELECTRODE REM PT RTRN 9FT ADLT (ELECTROSURGICAL) ×1 IMPLANT
ENDOLOOP SUT PDS II  0 18 (SUTURE) ×2
ENDOLOOP SUT PDS II 0 18 (SUTURE) IMPLANT
GAUZE SPONGE 2X2 8PLY STRL LF (GAUZE/BANDAGES/DRESSINGS) ×1 IMPLANT
GLOVE BIO SURGEON STRL SZ7 (GLOVE) ×2 IMPLANT
GLOVE BIOGEL PI IND STRL 7.5 (GLOVE) ×1 IMPLANT
GLOVE BIOGEL PI INDICATOR 7.5 (GLOVE) ×1
GOWN STRL REUS W/ TWL LRG LVL3 (GOWN DISPOSABLE) ×3 IMPLANT
GOWN STRL REUS W/TWL LRG LVL3 (GOWN DISPOSABLE) ×6
KIT BASIN OR (CUSTOM PROCEDURE TRAY) ×2 IMPLANT
KIT TURNOVER KIT B (KITS) ×2 IMPLANT
NS IRRIG 1000ML POUR BTL (IV SOLUTION) ×2 IMPLANT
PAD ARMBOARD 7.5X6 YLW CONV (MISCELLANEOUS) ×2 IMPLANT
POUCH RETRIEVAL ECOSAC 10 (ENDOMECHANICALS) IMPLANT
POUCH RETRIEVAL ECOSAC 10MM (ENDOMECHANICALS) ×2
POUCH SPECIMEN RETRIEVAL 10MM (ENDOMECHANICALS) IMPLANT
SCISSORS LAP 5X35 DISP (ENDOMECHANICALS) ×2 IMPLANT
SET CHOLANGIOGRAPH 5 50 .035 (SET/KITS/TRAYS/PACK) ×2 IMPLANT
SET IRRIG TUBING LAPAROSCOPIC (IRRIGATION / IRRIGATOR) ×2 IMPLANT
SET TUBE SMOKE EVAC HIGH FLOW (TUBING) ×2 IMPLANT
SLEEVE ENDOPATH XCEL 5M (ENDOMECHANICALS) ×2 IMPLANT
SPECIMEN JAR SMALL (MISCELLANEOUS) ×2 IMPLANT
SPONGE GAUZE 2X2 8PLY STRL LF (GAUZE/BANDAGES/DRESSINGS) ×1 IMPLANT
SPONGE GAUZE 2X2 STER 10/PKG (GAUZE/BANDAGES/DRESSINGS) ×1
STRIP CLOSURE SKIN 1/2X4 (GAUZE/BANDAGES/DRESSINGS) ×2 IMPLANT
SUT MNCRL AB 4-0 PS2 18 (SUTURE) ×2 IMPLANT
TOWEL GREEN STERILE (TOWEL DISPOSABLE) ×2 IMPLANT
TOWEL GREEN STERILE FF (TOWEL DISPOSABLE) ×2 IMPLANT
TRAY LAPAROSCOPIC MC (CUSTOM PROCEDURE TRAY) ×2 IMPLANT
TROCAR XCEL BLUNT TIP 100MML (ENDOMECHANICALS) ×2 IMPLANT
TROCAR XCEL NON-BLD 11X100MML (ENDOMECHANICALS) ×2 IMPLANT
TROCAR XCEL NON-BLD 5MMX100MML (ENDOMECHANICALS) ×2 IMPLANT
WATER STERILE IRR 1000ML POUR (IV SOLUTION) ×2 IMPLANT

## 2021-02-03 NOTE — Anesthesia Preprocedure Evaluation (Addendum)
Anesthesia Evaluation  Patient identified by MRN, date of birth, ID band Patient awake    Reviewed: Allergy & Precautions, NPO status , Patient's Chart, lab work & pertinent test results  Airway Mallampati: I  TM Distance: >3 FB Neck ROM: Full    Dental  (+) Teeth Intact   Pulmonary neg pulmonary ROS,    Pulmonary exam normal        Cardiovascular negative cardio ROS Normal cardiovascular exam     Neuro/Psych  Headaches, negative psych ROS   GI/Hepatic negative GI ROS, Neg liver ROS,   Endo/Other  Morbid obesity (BMI 48)PCOS  Renal/GU negative Renal ROS  negative genitourinary   Musculoskeletal negative musculoskeletal ROS (+)   Abdominal   Peds  Hematology negative hematology ROS (+)   Anesthesia Other Findings   Reproductive/Obstetrics                           Anesthesia Physical Anesthesia Plan  ASA: III  Anesthesia Plan: General   Post-op Pain Management:    Induction: Intravenous and Rapid sequence  PONV Risk Score and Plan: 4 or greater and Midazolam, Dexamethasone, Ondansetron and Treatment may vary due to age or medical condition  Airway Management Planned: Oral ETT  Additional Equipment: None  Intra-op Plan:   Post-operative Plan: Extubation in OR  Informed Consent: I have reviewed the patients History and Physical, chart, labs and discussed the procedure including the risks, benefits and alternatives for the proposed anesthesia with the patient or authorized representative who has indicated his/her understanding and acceptance.     Dental advisory given  Plan Discussed with: CRNA  Anesthesia Plan Comments:       Anesthesia Quick Evaluation

## 2021-02-03 NOTE — Anesthesia Procedure Notes (Signed)
Procedure Name: Intubation Date/Time: 02/03/2021 12:35 PM Performed by: Thelma Comp, CRNA Pre-anesthesia Checklist: Patient identified, Emergency Drugs available, Suction available and Patient being monitored Patient Re-evaluated:Patient Re-evaluated prior to induction Oxygen Delivery Method: Circle System Utilized Preoxygenation: Pre-oxygenation with 100% oxygen Induction Type: IV induction, Cricoid Pressure applied and Rapid sequence Laryngoscope Size: Mac and 3 Grade View: Grade I Tube type: Oral Tube size: 7.0 mm Number of attempts: 1 Airway Equipment and Method: Stylet Placement Confirmation: ETT inserted through vocal cords under direct vision,  positive ETCO2 and breath sounds checked- equal and bilateral Secured at: 22 cm Tube secured with: Tape Dental Injury: Teeth and Oropharynx as per pre-operative assessment

## 2021-02-03 NOTE — Op Note (Signed)
Procedure(s): LAPAROSCOPIC CHOLECYSTECTOMY WITH INTRAOPERATIVE CHOLANGIOGRAM Procedure Note  Jill Bass female 35 y.o. 02/03/2021  Procedure(s) and Anesthesia Type:    * LAPAROSCOPIC CHOLECYSTECTOMY WITH INTRAOPERATIVE CHOLANGIOGRAM - General  Surgeon(s) and Role:    Manus Rudd, MD - Primary    * Isabel Caprice, MD - Resident-Assisting   Indications: The patient is a 35 year old female with no significant past medical history who was admitted through the ED yesterday with signs and symptoms of acute cholecystitis.  Decision was made to proceed with laparoscopic cholecystectomy.     Surgeon: Manus Rudd  Assistants: Isabel Caprice   Anesthesia: General endotracheal anesthesia  ASA Class: 3   Procedure Detail LAPAROSCOPIC CHOLECYSTECTOMY WITH INTRAOPERATIVE CHOLANGIOGRAM  After informed consent was obtained, the patient was brought to the operating room and positioned on the operating room table in the supine position with both arms.  After an adequate level of general anesthesia been obtained, the abdomen was prepped and draped in the standard sterile fashion.  Preoperative antibiotics were started.  A surgical timeout was then performed following institutional protocol.  We started with a 1 mm transverse infraumbilical incision.  Dissection was carried down to the fascia bluntly.  The fascia was incised under direct visualization.  A 0 Vicryl pursestring suture was placed to hold the Hasson port in place.  Pneumoperitoneum was then achieved to an insufflation pressure of 15 mmHg.  Diagnostic laparoscopy was performed that revealed a thickened gallbladder with omentum covering the entire fundus and anterior gallbladder.  A additional 11 mm port was placed in epigastric area just to the right of the falciform ligament.  Two 5 mm ports were placed along the right costal margin.  By releasing the omental adhesions to the anterior wall of the gallbladder.  This was done in a  combination of blunt dissection and electrocautery.  Once this was accomplished, the gallbladder was retracted superiorly and laterally.  We then the dissection of the gallbladder infundibulum.  We incised the peritoneal lateral and medial.  We easily identified the cystic duct which was sequentially dissected out the recommendation of sharp dissection.  We also identified the cystic artery just medial to the note with the node of Calote sitting right on top of it.  We then clipped the cystic duct distally and performed a partial ductotomy.  A cholangiogram catheter was then introduced through a stab incision and secured in the cystic duct with a single clip.  Of note, one stone was extracted from the cystic duct prior today.  We then attempted a cholangiogram. However, we were only partially visualized the common bile duct due to leakage of contrast around the catheter through the cystic duct.  The cystic duct was then clipped with 1 10 mm clip proximal and 1 endo-loop.  The cystic artery was clipped with two 10 mm clips proximal and 1 distal and also transected between the clips.  We then dissected the gallbladder off the liver bed using hook cautery.  This was achieved without any difficulties.  Once the gallbladder was taken off the liver it was placed in a laparoscopic specimen retrieval bag and removed through the 10 mm port site.  Hemostasis of the gallbladder bed was confirmed.  The infraumbilical port site was closed with the previously placed 0 Vicryl stitch.  0.25% Marcaine was injected in all incisions.  The skin was then closed with 4-0 Biosyn and steri strips as well as a dry dressing were applied.  All needle and instrument counts were  correct at the end of the case.  The patient was awakened from anesthesia without any complications and transferred to the PACU in stable condition.   Findings: Acute cholecystitis with pericholecystic fluid, gallbladder wall thickening,  cholelithiasis  Estimated Blood Loss:  Minimal         Drains: None         Total IV Fluids: see anesthesia record  Blood Given: none          Specimens: gallbladder and contents         Implants: none        Complications:  * No complications entered in OR log *         Disposition: PACU - hemodynamically stable.         Condition: stable

## 2021-02-03 NOTE — Discharge Instructions (Signed)
[facility]  Please arrive at least 30 min before your appointment to complete your check in paperwork.  If you are unable to arrive 30 min prior to your appointment time we may have to cancel or reschedule you. LAPAROSCOPIC SURGERY: POST OP INSTRUCTIONS Always review your discharge instruction sheet given to you by the facility where your surgery was performed. IF YOU HAVE DISABILITY OR FAMILY LEAVE FORMS, YOU MUST BRING THEM TO THE OFFICE FOR PROCESSING.   DO NOT GIVE THEM TO YOUR DOCTOR.  PAIN CONTROL  1. First take acetaminophen (Tylenol) AND/or ibuprofen (Advil) to control your pain after surgery.  Follow directions on package.  Taking acetaminophen (Tylenol) and/or ibuprofen (Advil) regularly after surgery will help to control your pain and lower the amount of prescription pain medication you may need.  You should not take more than 4,000 mg (4 grams) of acetaminophen (Tylenol) in 24 hours.  You should not take ibuprofen (Advil), aleve, motrin, naprosyn or other NSAIDS if you have a history of stomach ulcers or chronic kidney disease.  2. A prescription for pain medication may be given to you upon discharge.  Take your pain medication as prescribed, if you still have uncontrolled pain after taking acetaminophen (Tylenol) or ibuprofen (Advil). 3. Use ice packs to help control pain. 4. If you need a refill on your pain medication, please contact your pharmacy.  They will contact our office to request authorization. Prescriptions will not be filled after 5pm or on week-ends.  HOME MEDICATIONS 5. Take your usually prescribed medications unless otherwise directed.  DIET 6. You should follow a light diet the first few days after arrival home.  Be sure to include lots of fluids daily. Avoid fatty, fried foods.   CONSTIPATION 7. It is common to experience some constipation after surgery and if you are taking pain medication.  Increasing fluid intake and taking a stool  softener (such as Colace) will usually help or prevent this problem from occurring.  A mild laxative (Milk of Magnesia or Miralax) should be taken according to package instructions if there are no bowel movements after 48 hours.  WOUND/INCISION CARE 8. Most patients will experience some swelling and bruising in the area of the incisions.  Ice packs will help.  Swelling and bruising can take several days to resolve.  9. Unless discharge instructions indicate otherwise, follow guidelines below  a. STERI-STRIPS - you may remove your outer bandages 48 hours after surgery, and you may shower at that time.  You have steri-strips (small skin tapes) in place directly over the incision.  These strips should be left on the skin for 7-10 days.   b. DERMABOND/SKIN GLUE - you may shower in 24 hours.  The glue will flake off over the next 2-3 weeks. 10. Any sutures or staples will be removed at the office during your follow-up visit.  ACTIVITIES 11. You may resume regular (light) daily activities beginning the next day--such as daily self-care, walking, climbing stairs--gradually increasing activities as tolerated.  You may have sexual intercourse when it is comfortable.  Refrain from any heavy lifting or straining until approved by your doctor. a. You may drive when you are no longer taking prescription pain medication, you can comfortably wear a seatbelt, and you can safely maneuver your car and apply brakes.  FOLLOW-UP 12. You should see your doctor in the office for a follow-up appointment approximately 2-3 weeks after your surgery.  You should have been given your post-op/follow-up appointment when   your surgery was scheduled.  If you did not receive a post-op/follow-up appointment, make sure that you call for this appointment within a day or two after you arrive home to insure a convenient appointment time.   WHEN TO CALL YOUR DOCTOR: 1. Fever over 101.0 2. Inability to urinate 3. Continued bleeding from  incision. 4. Increased pain, redness, or drainage from the incision. 5. Increasing abdominal pain  The clinic staff is available to answer your questions during regular business hours.  Please don't hesitate to call and ask to speak to one of the nurses for clinical concerns.  If you have a medical emergency, go to the nearest emergency room or call 911.  A surgeon from [facility] is always on call at the hospital. [address], [address], [place], [place]  [address] ? [address] [address], [place], [place]   [address] [phone] ? [phone] ? FAX [phone]   Gallbladder Eating Plan If you have a gallbladder condition, you may have trouble digesting fats. Eating a low-fat diet can help reduce your symptoms, and may be helpful before and after having surgery to remove your gallbladder (cholecystectomy). Your health care provider may recommend that you work with a diet and nutrition specialist (dietitian) to help you reduce the amount of fat in your diet. What are tips for following this plan? General guidelines  Limit your fat intake to less than 30% of your total daily calories. If you eat around 1,800 calories each day, this is less than 60 grams (g) of fat per day.  Fat is an important part of a healthy diet. Eating a low-fat diet can make it hard to maintain a healthy body weight. Ask your dietitian how much fat, calories, and other nutrients you need each day.  Eat small, frequent meals throughout the day instead of three large meals.  Drink at least 8-10 cups of fluid a day. Drink enough fluid to keep your urine clear or pale yellow.  Limit alcohol intake to no more than 1 drink a day for nonpregnant women and 2 drinks a day for men. One drink equals 12 oz of beer, 5 oz of wine, or 1 oz of hard liquor. Reading food labels  Check Nutrition Facts on food labels for the amount of fat per serving. Choose foods with less than 3 grams of fat per serving.    Shopping  Choose nonfat and low-fat healthy foods. Look for the words "nonfat," "low fat," or "fat free."  Avoid buying processed or prepackaged foods. Cooking  Cook using low-fat methods, such as baking, broiling, grilling, or boiling.  Cook with small amounts of healthy fats, such as olive oil, grapeseed oil, canola oil, or sunflower oil. What foods are recommended?  All fresh, frozen, or canned fruits and vegetables.  Whole grains.  Low-fat or non-fat (skim) milk and yogurt.  Lean meat, skinless poultry, fish, eggs, and beans.  Low-fat protein supplement powders or drinks.  Spices and herbs. What foods are not recommended?  High-fat foods. These include baked goods, fast food, fatty cuts of meat, ice cream, french toast, sweet rolls, pizza, cheese bread, foods covered with butter, creamy sauces, or cheese.  Fried foods. These include french fries, tempura, battered fish, breaded chicken, fried breads, and sweets.  Foods with strong odors.  Foods that cause bloating and gas. Summary  A low-fat diet can be helpful if you have a gallbladder condition, or before and after gallbladder surgery.  Limit your fat intake to   less than 30% of your total daily calories. This is about 60 g of fat if you eat 1,800 calories each day.  Eat small, frequent meals throughout the day instead of three large meals. This information is not intended to replace advice given to you by your health care provider. Make sure you discuss any questions you have with your health care provider. Document Revised: 05/30/2020 Document Reviewed: 05/30/2020 Elsevier Patient Education  2021 [employer]vier Inc.  

## 2021-02-03 NOTE — Transfer of Care (Signed)
Immediate Anesthesia Transfer of Care Note  Patient: Jill Bass  Procedure(s) Performed: LAPAROSCOPIC CHOLECYSTECTOMY WITH INTRAOPERATIVE CHOLANGIOGRAM (N/A Abdomen)  Patient Location: PACU  Anesthesia Type:General  Level of Consciousness: awake, alert  and patient cooperative  Airway & Oxygen Therapy: Patient Spontanous Breathing and Patient connected to face mask oxygen  Post-op Assessment: Report given to RN and Post -op Vital signs reviewed and stable  Post vital signs: Reviewed and stable  Last Vitals:  Vitals Value Taken Time  BP 120/64 02/03/21 1410  Temp    Pulse 88 02/03/21 1411  Resp 14 02/03/21 1411  SpO2 98 % 02/03/21 1411  Vitals shown include unvalidated device data.  Last Pain:  Vitals:   02/03/21 0852  TempSrc:   PainSc: Asleep      Patients Stated Pain Goal: (P) 0 (02/03/21 0139)  Complications: No complications documented.

## 2021-02-03 NOTE — Progress Notes (Signed)
Subjective/Chief Complaint: Less tender, but still has RUQ/ epigastric pain.   Objective: Vital signs in last 24 hours: Temp:  [97.5 F (36.4 C)-98 F (36.7 C)] 98 F (36.7 C) (04/11 0451) Pulse Rate:  [66-93] 93 (04/11 0451) Resp:  [16-19] 17 (04/11 0451) BP: (112-144)/(63-107) 135/87 (04/11 0451) SpO2:  [91 %-98 %] 96 % (04/11 0451) Last BM Date: 02/01/21  Intake/Output from previous day: 04/10 0701 - 04/11 0700 In: 1750 [I.V.:750; IV Piggyback:1000] Out: 2 [Urine:2] Intake/Output this shift: No intake/output data recorded.  General appearance: alert, cooperative and no distress GI: obese, tender in RUQ  Lab Results:  Recent Labs    02/02/21 0614  WBC 8.6  HGB 12.3  HCT 36.4  PLT 249   BMET Recent Labs    02/02/21 0614 02/03/21 0114  NA 137 138  K 3.5 3.6  CL 104 105  CO2 22 29  GLUCOSE 191* 120*  BUN 21* 13  CREATININE 0.80 0.82  CALCIUM 9.0 9.0   Hepatic Function Latest Ref Rng & Units 02/03/2021 02/02/2021  Total Protein 6.5 - 8.1 g/dL 6.7 7.2  Albumin 3.5 - 5.0 g/dL 3.4(L) 3.8  AST 15 - 41 U/L 27 23  ALT 0 - 44 U/L 34 29  Alk Phosphatase 38 - 126 U/L 53 52  Total Bilirubin 0.3 - 1.2 mg/dL 0.4 0.3    PT/INR No results for input(s): LABPROT, INR in the last 72 hours. ABG No results for input(s): PHART, HCO3 in the last 72 hours.  Invalid input(s): PCO2, PO2  Studies/Results: CT Abdomen Pelvis W Contrast  Result Date: 02/02/2021 CLINICAL DATA:  Abdominal pain EXAM: CT ABDOMEN AND PELVIS WITH CONTRAST TECHNIQUE: Multidetector CT imaging of the abdomen and pelvis was performed using the standard protocol following bolus administration of intravenous contrast. CONTRAST:  173m OMNIPAQUE IOHEXOL 300 MG/ML  SOLN COMPARISON:  October 05, 2011 FINDINGS: Lower chest: Lung bases are clear. Hepatobiliary: No focal liver lesions are appreciable. The gallbladder is mildly distended without gallbladder wall thickening. No appreciable biliary duct  dilatation. Pancreas: There is no pancreatic mass or inflammatory focus. Spleen: No splenic lesions are evident. Adrenals/Urinary Tract: Adrenals bilaterally appear normal. There is no renal mass or hydronephrosis on either side. No renal or ureteral calculus on either side. Urinary bladder is midline with wall thickness within normal limits. Stomach/Bowel: Stomach is filled with fluid. Stomach wall is not thickened. There is no appreciable bowel wall or mesenteric thickening on this study. There are occasional sigmoid diverticula without diverticulitis. No evident bowel obstruction. The terminal ileum appears normal. The appendix appears normal. There is no demonstrable free air or portal venous air. Vascular/Lymphatic: No abdominal aortic aneurysm. No arterial vascular lesions are evident. Major venous structures appear patent. No evident adenopathy in the abdomen or pelvis. Reproductive: Uterus is anteverted.  No adnexal masses evident. Other: No ascites or abscess evident in the abdomen or pelvis. There is fat in the umbilicus. Musculoskeletal: No blastic or lytic bone lesions. No evident chest wall lesions. IMPRESSION: 1. Gallbladder mildly distended without gallbladder wall thickening. 2. Sigmoid diverticula without diverticulitis. No bowel wall thickening or bowel obstruction. No abscess in the abdomen or pelvis. Appendix appears normal. 3. No renal or ureteral calculus. No hydronephrosis. Urinary bladder wall thickness within normal limits. Electronically Signed   By: WLowella GripIII M.D.   On: 02/02/2021 09:10   UKoreaAbdomen Limited RUQ (LIVER/GB)  Result Date: 02/02/2021 CLINICAL DATA:  Upper abdominal pain EXAM: ULTRASOUND ABDOMEN LIMITED RIGHT UPPER  QUADRANT COMPARISON:  CT abdomen and pelvis February 02, 2021 FINDINGS: Gallbladder: Within the gallbladder, there are scattered echogenic foci which move and shadow consistent with cholelithiasis. Largest gallstone measures 7 mm in length. There is also  sludge in the gallbladder. There is no appreciable gallbladder wall thickening or pericholecystic fluid. No sonographic Murphy sign noted by sonographer. Common bile duct: Diameter: 2 mm. No intrahepatic or extrahepatic biliary duct dilatation. Liver: No focal lesion identified. Within normal limits in parenchymal echogenicity. Portal vein is patent on color Doppler imaging with normal direction of blood flow towards the liver. Other: None. IMPRESSION: Cholelithiasis and mild sludge in gallbladder. No gallbladder wall thickening or pericholecystic fluid. Study otherwise unremarkable. Electronically Signed   By: Lowella Grip III M.D.   On: 02/02/2021 11:23    Anti-infectives: Anti-infectives (From admission, onward)   Start     Dose/Rate Route Frequency Ordered Stop   02/03/21 0600  ciprofloxacin (CIPRO) IVPB 400 mg        400 mg 200 mL/hr over 60 Minutes Intravenous On call to O.R. 02/02/21 1730 02/04/21 0559   02/03/21 0600  metroNIDAZOLE (FLAGYL) IVPB 500 mg        500 mg 100 mL/hr over 60 Minutes Intravenous On call to O.R. 02/02/21 1730 02/04/21 0559      Assessment/Plan: s/p Procedure(s): LAPAROSCOPIC CHOLECYSTECTOMY WITH INTRAOPERATIVE CHOLANGIOGRAM (N/A)  The surgical procedure has been discussed with the patient.  Potential risks, benefits, alternative treatments, and expected outcomes have been explained.  All of the patient's questions at this time have been answered.  The likelihood of reaching the patient's treatment goal is good.  The patient understand the proposed surgical procedure and wishes to proceed.   LOS: 1 day    Maia Petties 02/03/2021

## 2021-02-03 NOTE — Anesthesia Postprocedure Evaluation (Signed)
Anesthesia Post Note  Patient: Jill Bass  Procedure(s) Performed: LAPAROSCOPIC CHOLECYSTECTOMY WITH INTRAOPERATIVE CHOLANGIOGRAM (N/A Abdomen)     Patient location during evaluation: PACU Anesthesia Type: General Level of consciousness: awake Pain management: pain level controlled Vital Signs Assessment: post-procedure vital signs reviewed and stable Respiratory status: spontaneous breathing, nonlabored ventilation, respiratory function stable and patient connected to nasal cannula oxygen Cardiovascular status: blood pressure returned to baseline and stable Postop Assessment: no apparent nausea or vomiting Anesthetic complications: no   No complications documented.  Last Vitals:  Vitals:   02/03/21 1510 02/03/21 1532  BP: 123/77 129/90  Pulse: 80 81  Resp: 12 14  Temp: 37.1 C 36.7 C  SpO2: 99% 100%    Last Pain:  Vitals:   02/03/21 1532  TempSrc: Oral  PainSc:                  [name]

## 2021-02-03 NOTE — Progress Notes (Signed)
Patient stated she has a small rash on abdomen and Dr. Corliss Skains is aware

## 2021-02-04 ENCOUNTER — Encounter (HOSPITAL_COMMUNITY): Payer: Self-pay | Admitting: Surgery

## 2021-02-04 LAB — CBC
HCT: 35.7 % — ABNORMAL LOW (ref 36.0–46.0)
Hemoglobin: 11.5 g/dL — ABNORMAL LOW (ref 12.0–15.0)
MCH: 28.3 pg (ref 26.0–34.0)
MCHC: 32.2 g/dL (ref 30.0–36.0)
MCV: 87.7 fL (ref 80.0–100.0)
Platelets: 229 10*3/uL (ref 150–400)
RBC: 4.07 MIL/uL (ref 3.87–5.11)
RDW: 13.6 % (ref 11.5–15.5)
WBC: 12.2 10*3/uL — ABNORMAL HIGH (ref 4.0–10.5)
nRBC: 0 % (ref 0.0–0.2)

## 2021-02-04 LAB — COMPREHENSIVE METABOLIC PANEL
ALT: 51 U/L — ABNORMAL HIGH (ref 0–44)
AST: 45 U/L — ABNORMAL HIGH (ref 15–41)
Albumin: 3.2 g/dL — ABNORMAL LOW (ref 3.5–5.0)
Alkaline Phosphatase: 50 U/L (ref 38–126)
Anion gap: 7 (ref 5–15)
BUN: 10 mg/dL (ref 6–20)
CO2: 24 mmol/L (ref 22–32)
Calcium: 8.7 mg/dL — ABNORMAL LOW (ref 8.9–10.3)
Chloride: 107 mmol/L (ref 98–111)
Creatinine, Ser: 0.7 mg/dL (ref 0.44–1.00)
GFR, Estimated: >60 mL/min (ref >60)
Glucose, Bld: 127 mg/dL — ABNORMAL HIGH (ref 70–99)
Potassium: 4 mmol/L (ref 3.5–5.1)
Sodium: 138 mmol/L (ref 135–145)
Total Bilirubin: 0.6 mg/dL (ref 0.3–1.2)
Total Protein: 6.3 g/dL — ABNORMAL LOW (ref 6.5–8.1)

## 2021-02-04 LAB — SURGICAL PATHOLOGY

## 2021-02-04 MED ORDER — OXYCODONE HCL 5 MG PO TABS
5.0000 mg | ORAL_TABLET | ORAL | Status: DC | PRN
  Administered 2021-02-04: 10 mg via ORAL
  Administered 2021-02-04: 5 mg via ORAL
  Filled 2021-02-04 (×2): qty 2

## 2021-02-04 MED ORDER — OXYCODONE HCL 5 MG PO TABS: 5.0000 mg | ORAL_TABLET | Freq: Four times a day (QID) | ORAL | 0 refills | Status: DC | PRN

## 2021-02-04 MED ORDER — ACETAMINOPHEN 500 MG PO TABS: 1000.0000 mg | ORAL_TABLET | Freq: Three times a day (TID) | ORAL | 0 refills | Status: AC | PRN

## 2021-02-04 NOTE — Progress Notes (Signed)
DC home with mother at this time. DC instructions given and explained to  mother and patient. No verbal questions from pt or mother.

## 2021-02-04 NOTE — Discharge Summary (Signed)
Patient ID: Jill Bass [id number] 1986-01-03 34 y.o.  Admit date: 02/02/2021 Discharge date: 02/04/2021  Admitting Diagnosis: Subacute cholecystitis  Discharge Diagnosis Acute cholecystitis  Consultants None  H&P Patient is a 35 year old female, otherwise healthy who comes in secondary to epigastric abdominal pain.  Patient states that pain began last night at approximately 3:30 AM.  She states that her pain was in the epigastrium and radiated down to her umbilicus bilateral flank areas.  She states that the pain is continued.  She presented to the ER secondary to continued pain.  Patient denies any nausea or vomiting.  Upon evaluation the outside ER patient underwent ultrasound and CT scan.  I did review these personally.  This was significant for gallstones however no signs of cholecystitis.  Patient had continued unrelenting pain.  Patient was transferred for further surgical care.  Patient had no previous abdominal surgery.  Procedures Dr. Georgette Dover - Laparoscopic Cholecystectomy with [place] - 02/03/2021  Hospital Course:  The patient was admitted and underwent a laparoscopic cholecystectomy with IOC.  The patient tolerated the procedure well. IOC showed No evidence of biliary ductal dilatation, stenosis or stricture. There were no filling defect to suggest choledocholithiasis. Contrast material passed through the ampulla and into the duodenum. There was extravasation of contrast material was also present within the subhepatic space. Repeat labs in the AM with minimally elevated AST at 45 (from 27), ALT at 51 (from 34), normal alk phos and T. Bili. Mildly elevated AST and ALT felt likely from cauterization.  On POD 1, the patient was tolerating a diet, voiding well, mobilizing, and pain was controlled with medications. The patient was stable for DC home at this time with appropriate follow up made. Discharge instructions, restrictions and return precautions were discussed with the  patient. She was provided a note for work.   Physical Exam: Gen:  Alert, NAD, pleasant Card:  RRR, no M/G/R heard Pulm:  CTAB, no W/R/R, effort normal Abd: Soft, ND, appropriately tender around laparoscopic incisions without peritonitis. Otherwise NT. +BS. Incisions with gauze and Tegaderm dressings in place that are c/d/i Ext:  No LE edema  Psych: A&Ox3  Skin: no rashes noted, warm and dry  Allergies as of 02/04/2021      Reactions   Penicillins Hives   Latex Rash      Medication List    TAKE these medications   acetaminophen 500 MG tablet Commonly known as: TYLENOL Take 2 tablets (1,000 mg total) by mouth every 8 (eight) hours as needed for up to 5 days for mild pain or moderate pain.   clobetasol ointment 0.05 % Commonly known as: TEMOVATE Apply 1 application topically 2 (two) times daily.   eletriptan 40 MG tablet Commonly known as: RELPAX Take 40 mg by mouth every 2 (two) hours as needed for migraine. may repeat in 2 hours if necessary migraine   ibuprofen 200 MG tablet Commonly known as: ADVIL Take 200 mg by mouth every 6 (six) hours as needed for mild pain.   oxyCODONE 5 MG immediate release tablet Commonly known as: Oxy IR/ROXICODONE Take 1 tablet (5 mg total) by mouth every 6 (six) hours as needed for breakthrough pain.   simethicone 125 MG chewable tablet Commonly known as: MYLICON Chew 409 mg by mouth every 6 (six) hours as needed. gas         Follow-up Information    Surgery, [place]. Go on 02/25/2021.   Specialty: General Surgery Why: 830am. Please arrive 30 minutes  prior to your appointment for paperwork. Please bring a copy of your photo ID and insurance card.  Contact information: [address] [place] [address] [phone]               Signed: Alferd Apa, [facility] Surgery 02/04/2021, 8:44 AM Please see Amion for pager number during day hours 7:00am-4:30pm

## 2021-02-20 ENCOUNTER — Inpatient Hospital Stay (HOSPITAL_BASED_OUTPATIENT_CLINIC_OR_DEPARTMENT_OTHER)
Admission: EM | Admit: 2021-02-20 | Discharge: 2021-02-24 | DRG: 381 | Disposition: A | Discharge status: Home / Self Care | Payer: Managed Care, Other (non HMO) | Attending: Family Medicine | Admitting: Family Medicine

## 2021-02-20 ENCOUNTER — Encounter (HOSPITAL_BASED_OUTPATIENT_CLINIC_OR_DEPARTMENT_OTHER): Payer: Self-pay | Admitting: Emergency Medicine

## 2021-02-20 ENCOUNTER — Emergency Department (HOSPITAL_BASED_OUTPATIENT_CLINIC_OR_DEPARTMENT_OTHER): Payer: Managed Care, Other (non HMO)

## 2021-02-20 ENCOUNTER — Other Ambulatory Visit: Payer: Self-pay

## 2021-02-20 DIAGNOSIS — Z79899 Other long term (current) drug therapy: Secondary | ICD-10-CM

## 2021-02-20 DIAGNOSIS — E871 Hypo-osmolality and hyponatremia: Secondary | ICD-10-CM | POA: Diagnosis present

## 2021-02-20 DIAGNOSIS — R933 Abnormal findings on diagnostic imaging of other parts of digestive tract: Secondary | ICD-10-CM

## 2021-02-20 DIAGNOSIS — Z9049 Acquired absence of other specified parts of digestive tract: Secondary | ICD-10-CM

## 2021-02-20 DIAGNOSIS — Z20822 Contact with and (suspected) exposure to covid-19: Secondary | ICD-10-CM | POA: Diagnosis present

## 2021-02-20 DIAGNOSIS — R1013 Epigastric pain: Secondary | ICD-10-CM | POA: Diagnosis present

## 2021-02-20 DIAGNOSIS — R7989 Other specified abnormal findings of blood chemistry: Secondary | ICD-10-CM | POA: Diagnosis present

## 2021-02-20 DIAGNOSIS — Z91040 Latex allergy status: Secondary | ICD-10-CM

## 2021-02-20 DIAGNOSIS — K219 Gastro-esophageal reflux disease without esophagitis: Secondary | ICD-10-CM | POA: Diagnosis present

## 2021-02-20 DIAGNOSIS — N83201 Unspecified ovarian cyst, right side: Secondary | ICD-10-CM | POA: Diagnosis present

## 2021-02-20 DIAGNOSIS — K2210 Ulcer of esophagus without bleeding: Secondary | ICD-10-CM

## 2021-02-20 DIAGNOSIS — R109 Unspecified abdominal pain: Secondary | ICD-10-CM | POA: Diagnosis present

## 2021-02-20 DIAGNOSIS — K2970 Gastritis, unspecified, without bleeding: Secondary | ICD-10-CM | POA: Diagnosis present

## 2021-02-20 DIAGNOSIS — Z880 Allergy status to penicillin: Secondary | ICD-10-CM

## 2021-02-20 DIAGNOSIS — Z6841 Body Mass Index (BMI) 40.0 and over, adult: Secondary | ICD-10-CM

## 2021-02-20 LAB — COMPREHENSIVE METABOLIC PANEL
ALT: 38 U/L (ref 0–44)
AST: 56 U/L — ABNORMAL HIGH (ref 15–41)
Albumin: 3.9 g/dL (ref 3.5–5.0)
Alkaline Phosphatase: 65 U/L (ref 38–126)
Anion gap: 11 (ref 5–15)
BUN: 13 mg/dL (ref 6–20)
CO2: 22 mmol/L (ref 22–32)
Calcium: 9.3 mg/dL (ref 8.9–10.3)
Chloride: 101 mmol/L (ref 98–111)
Creatinine, Ser: 0.75 mg/dL (ref 0.44–1.00)
GFR, Estimated: >60 mL/min (ref >60)
Glucose, Bld: 116 mg/dL — ABNORMAL HIGH (ref 70–99)
Potassium: 3.5 mmol/L (ref 3.5–5.1)
Sodium: 134 mmol/L — ABNORMAL LOW (ref 135–145)
Total Bilirubin: 1.2 mg/dL (ref 0.3–1.2)
Total Protein: 7.4 g/dL (ref 6.5–8.1)

## 2021-02-20 LAB — CBC WITH DIFFERENTIAL/PLATELET
Abs Immature Granulocytes: 0.03 10*3/uL (ref 0.00–0.07)
Basophils Absolute: 0.1 10*3/uL (ref 0.0–0.1)
Basophils Relative: 1 %
Eosinophils Absolute: 0.2 10*3/uL (ref 0.0–0.5)
Eosinophils Relative: 2 %
HCT: 38.5 % (ref 36.0–46.0)
Hemoglobin: 13.1 g/dL (ref 12.0–15.0)
Immature Granulocytes: 0 %
Lymphocytes Relative: 34 %
Lymphs Abs: 3.6 10*3/uL (ref 0.7–4.0)
MCH: 28.4 pg (ref 26.0–34.0)
MCHC: 34 g/dL (ref 30.0–36.0)
MCV: 83.3 fL (ref 80.0–100.0)
Monocytes Absolute: 0.7 10*3/uL (ref 0.1–1.0)
Monocytes Relative: 6 %
Neutro Abs: 6.2 10*3/uL (ref 1.7–7.7)
Neutrophils Relative %: 57 %
Platelets: 270 10*3/uL (ref 150–400)
RBC: 4.62 MIL/uL (ref 3.87–5.11)
RDW: 13.3 % (ref 11.5–15.5)
WBC: 10.8 10*3/uL — ABNORMAL HIGH (ref 4.0–10.5)
nRBC: 0 % (ref 0.0–0.2)

## 2021-02-20 LAB — HCG, QUANTITATIVE, PREGNANCY: hCG, Beta Chain, Quant, S: <1 m[IU]/mL (ref <5)

## 2021-02-20 LAB — LACTIC ACID, PLASMA: Lactic Acid, Venous: 1.5 mmol/L (ref 0.5–1.9)

## 2021-02-20 LAB — MAGNESIUM: Magnesium: 1.9 mg/dL (ref 1.7–2.4)

## 2021-02-20 LAB — LIPASE, BLOOD: Lipase: 37 U/L (ref 11–51)

## 2021-02-20 IMAGING — CT CT ABD-PELV W/ CM
2 of 4 series · 16 of 46 positions shown, 18 images · IV contrast (Omnipaque)
Comparison: [DATE]

CLINICAL DATA: Epigastric pain after gallbladder removal beginning
[DATE].

EXAM:
CT ABDOMEN AND PELVIS WITH CONTRAST
TECHNIQUE: Multidetector CT imaging of the abdomen and pelvis was performed
using the standard protocol following bolus administration of
intravenous contrast.
CONTRAST:  100mL OMNIPAQUE IOHEXOL 300 MG/ML  SOLN

[Series 2: axial st · axial · 0.82mm/px · z∈[-476,-41]mm · 13 of 95 slices shown, 15 images]
[im 4/95  soft-tissue]
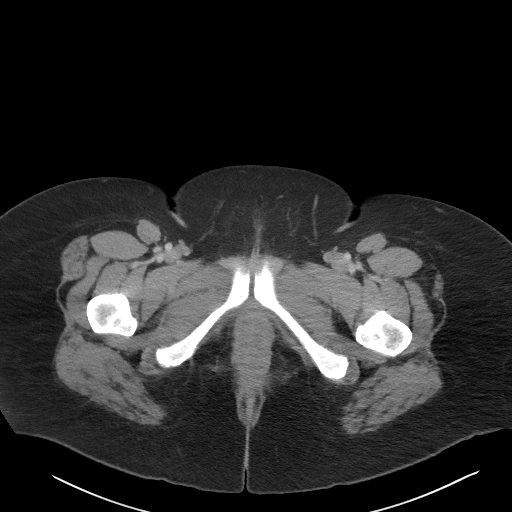
[im 4/95  bone]
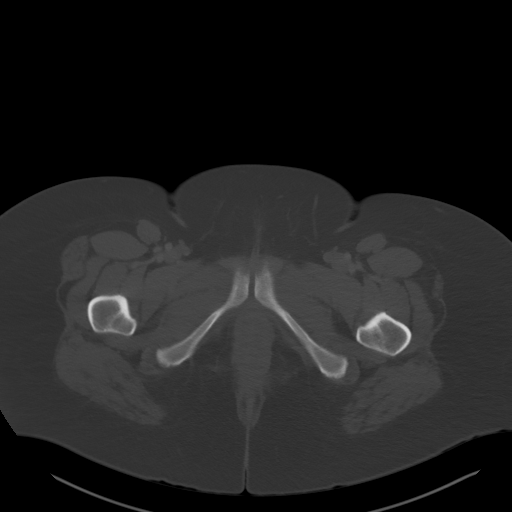
[im 12/95  soft-tissue]
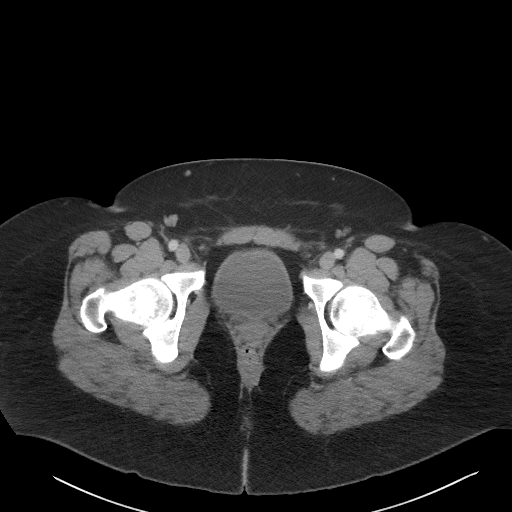
[im 20/95  soft-tissue]
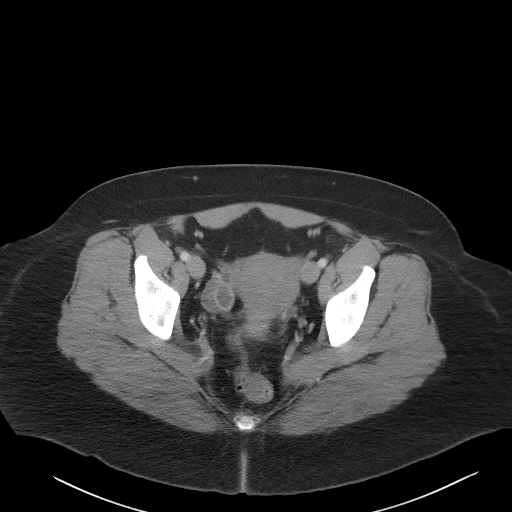
[im 28/95  soft-tissue]
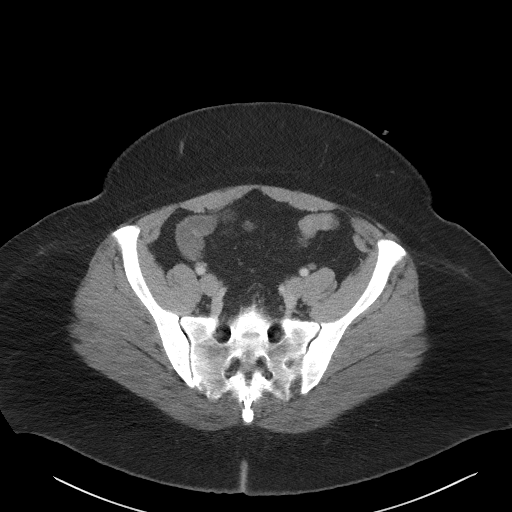
[im 32/95  soft-tissue]
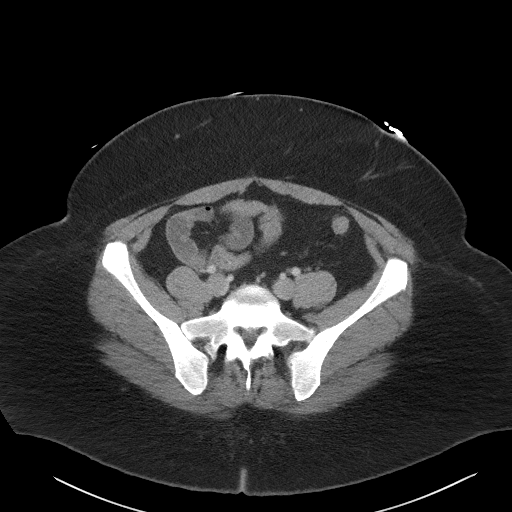
[im 40/95  soft-tissue]
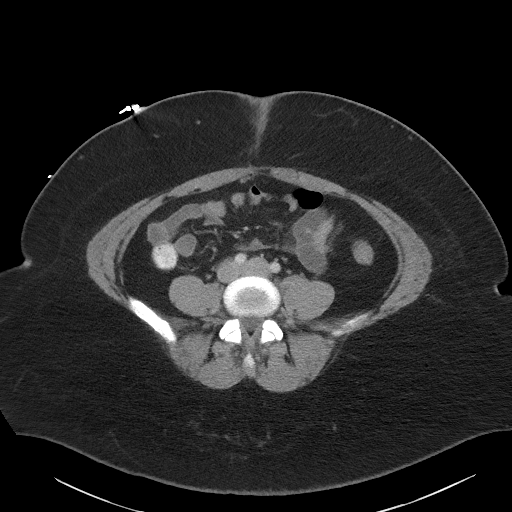
[im 48/95  soft-tissue]
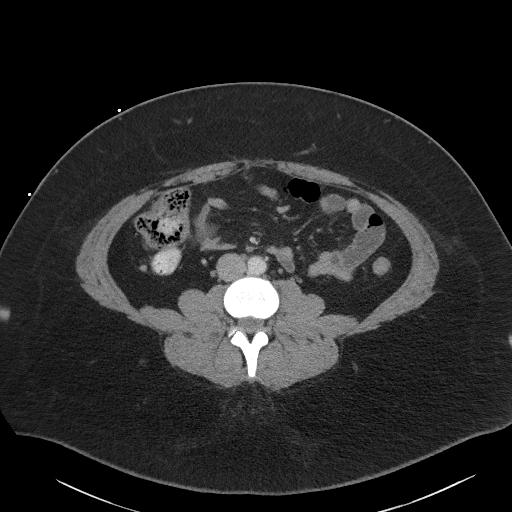
[im 55/95  soft-tissue]
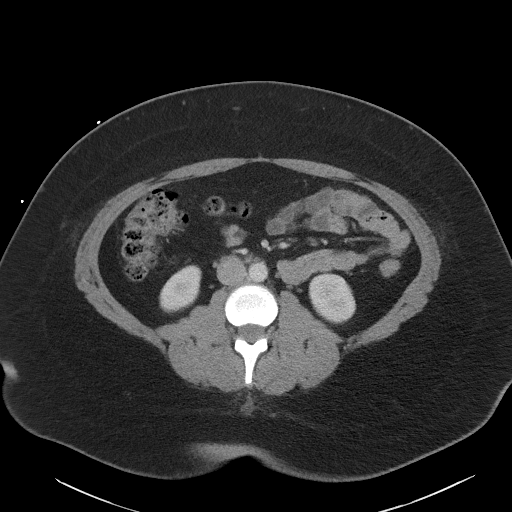
[im 63/95  soft-tissue]
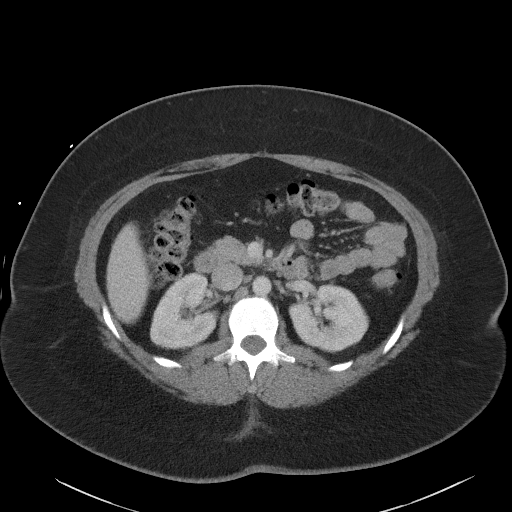
[im 63/95  bone]
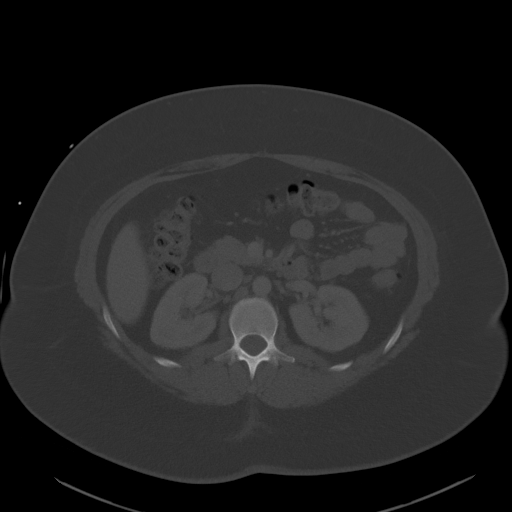
[im 67/95  soft-tissue]
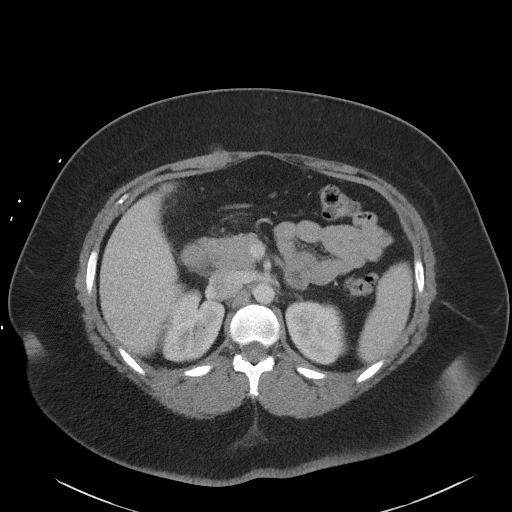
[im 75/95  soft-tissue]
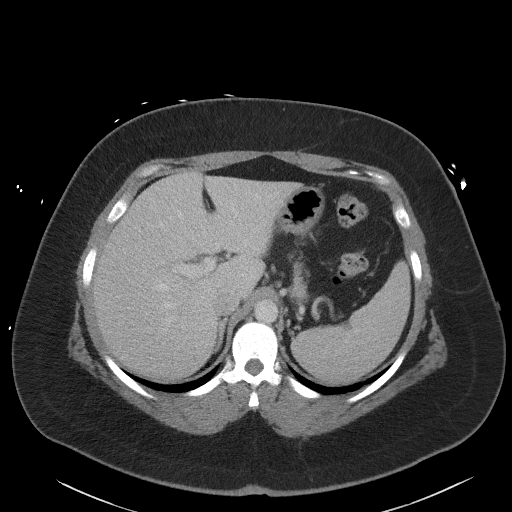
[im 83/95  soft-tissue]
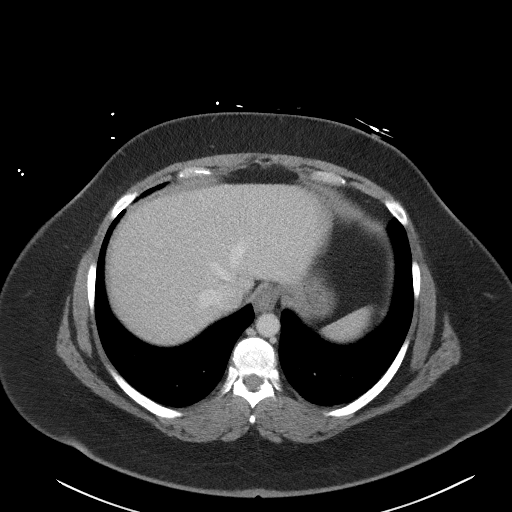
[im 91/95  soft-tissue]
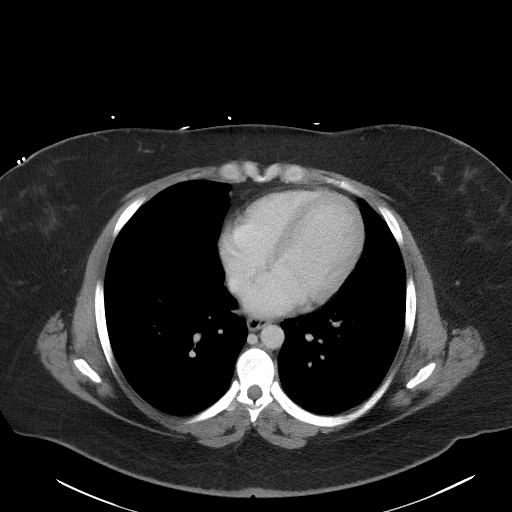

[Series 5: coronal st · coronal · 0.79mm/px · 3 of 109 slices shown]
[im 37/109  soft-tissue]
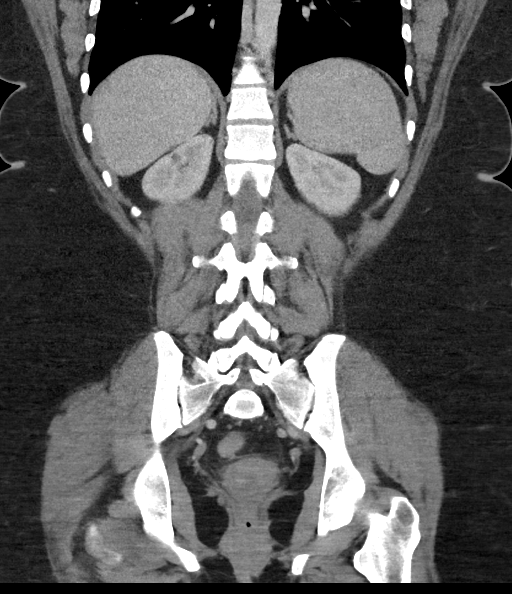
[im 49/109  soft-tissue]
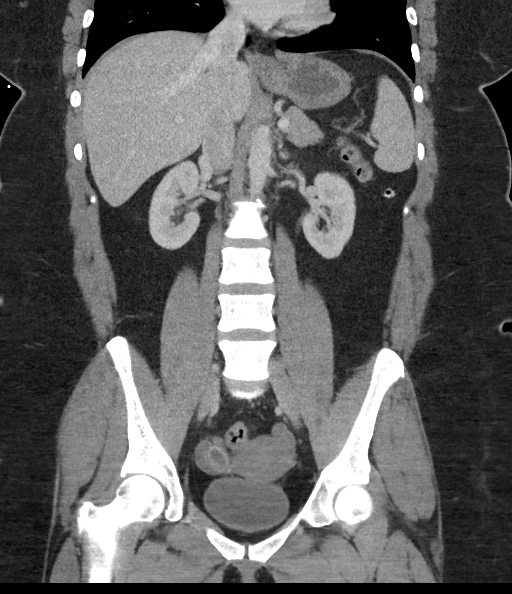
[im 61/109  soft-tissue]
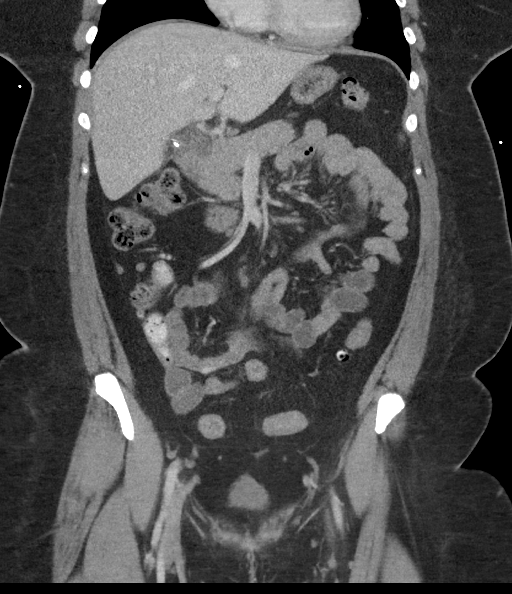

[16 of 46 positions shown; findings below may reference images not displayed]

FINDINGS: Lower chest: The lung bases are clear.

Hepatobiliary: Surgical absence of the gallbladder. Small amount of
fluid and stranding in the gallbladder fossa is likely
postoperative. No loculated collection. No bile duct dilatation. No
focal liver lesions.

Pancreas: Unremarkable. No pancreatic ductal dilatation or
surrounding inflammatory changes.

Spleen: Normal in size without focal abnormality.

Adrenals/Urinary Tract: Adrenal glands are unremarkable. Kidneys are
normal, without renal calculi, focal lesion, or hydronephrosis.
Bladder is unremarkable.

Stomach/Bowel: The stomach, small bowel, and colon are not
abnormally distended. Scattered diverticula in the colon. No
evidence of acute diverticulitis. Appendix is normal.

Vascular/Lymphatic: No significant vascular findings are present. No
enlarged abdominal or pelvic lymph nodes.

Reproductive: Uterus and ovaries are not enlarged. Involuting cyst
in the right ovary.

Other: No free air or free fluid in the abdomen. Minimal
periumbilical hernia containing fat.

Musculoskeletal: No acute or significant osseous findings.
IMPRESSION: 1. Surgical absence of the gallbladder. Small amount of fluid and
stranding in the gallbladder fossa is likely postoperative. No
loculated collection.
2. No evidence of bowel obstruction or inflammation.
3. Involuting cyst in the right ovary.
4. Minimal periumbilical hernia containing fat.

## 2021-02-20 IMAGING — DX DG CHEST 1V PORT
1 series · 1 of 1 positions shown · non-contrast
Comparison: None.

CLINICAL DATA: Epigastric pain.  Cholecystectomy 2 weeks ago.

EXAM:
PORTABLE CHEST 1 VIEW

[chest ap]
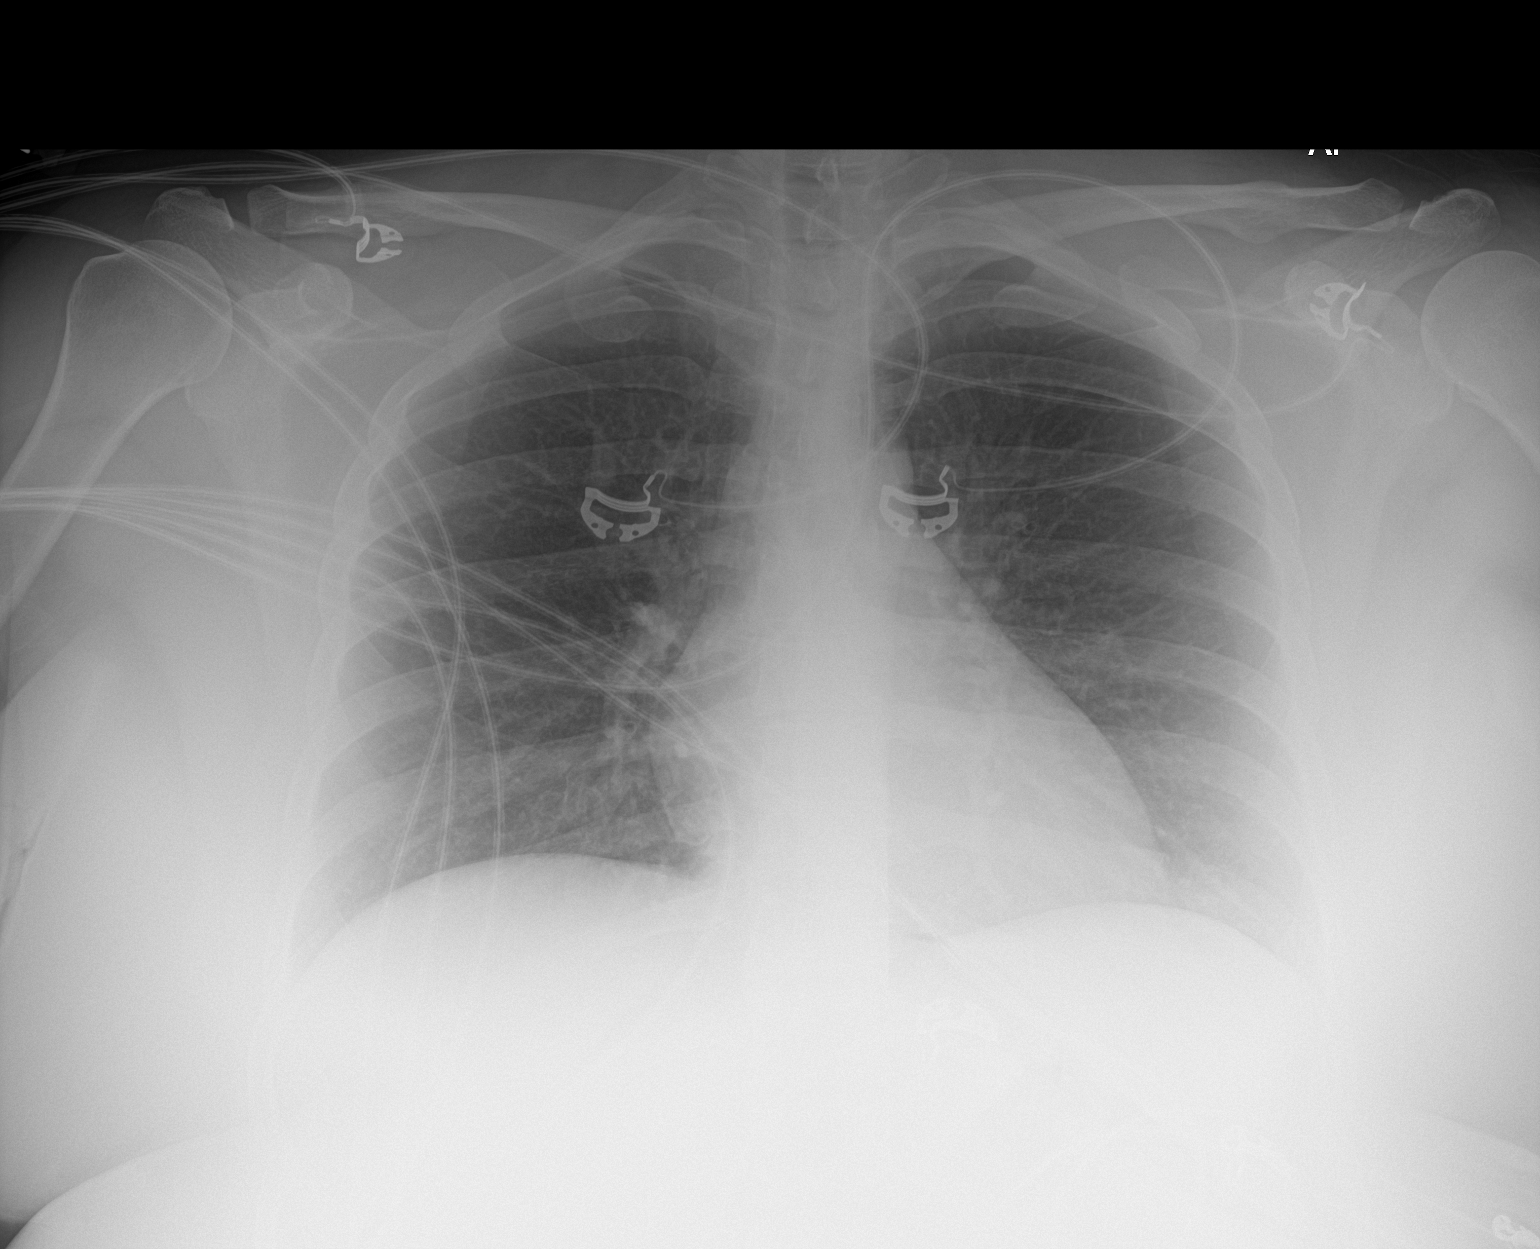

[1 of 1 positions shown; findings below may reference images not displayed]

FINDINGS: The cardiomediastinal contours are normal. The lungs are clear.
Pulmonary vasculature is normal. No consolidation, pleural effusion,
or pneumothorax. No acute osseous abnormalities are seen.
IMPRESSION: Negative portable view of the chest.

## 2021-02-20 MED ORDER — ONDANSETRON HCL 4 MG/2ML IJ SOLN
4.0000 mg | Freq: Once | INTRAMUSCULAR | Status: AC
  Administered 2021-02-20: 4 mg via INTRAVENOUS
  Filled 2021-02-20: qty 2

## 2021-02-20 MED ORDER — HYDROMORPHONE HCL 1 MG/ML IJ SOLN
1.0000 mg | Freq: Once | INTRAMUSCULAR | Status: AC
  Administered 2021-02-20: 1 mg via INTRAVENOUS
  Filled 2021-02-20: qty 1

## 2021-02-20 MED ORDER — HYDROMORPHONE HCL 1 MG/ML IJ SOLN
1.0000 mg | Freq: Once | INTRAMUSCULAR | Status: AC
  Administered 2021-02-21: 1 mg via INTRAVENOUS
  Filled 2021-02-20: qty 1

## 2021-02-20 MED ORDER — SODIUM CHLORIDE 0.9 % IV SOLN: INTRAVENOUS | Status: DC

## 2021-02-20 MED ORDER — IOHEXOL 300 MG/ML  SOLN
100.0000 mL | Freq: Once | INTRAMUSCULAR | Status: AC | PRN
  Administered 2021-02-20: 100 mL via INTRAVENOUS

## 2021-02-20 MED ORDER — PANTOPRAZOLE SODIUM 40 MG IV SOLR
40.0000 mg | Freq: Once | INTRAVENOUS | Status: AC
  Administered 2021-02-20: 40 mg via INTRAVENOUS
  Filled 2021-02-20: qty 40

## 2021-02-20 MED ORDER — SODIUM CHLORIDE 0.9 % IV BOLUS
1000.0000 mL | Freq: Once | INTRAVENOUS | Status: AC
  Administered 2021-02-20: 1000 mL via INTRAVENOUS

## 2021-02-20 MED ORDER — HYDROMORPHONE HCL 1 MG/ML IJ SOLN
1.0000 mg | Freq: Once | INTRAMUSCULAR | Status: AC
Start: 2021-02-20 — End: 2021-02-20
  Administered 2021-02-20: 1 mg via INTRAVENOUS
  Filled 2021-02-20: qty 1

## 2021-02-20 NOTE — ED Notes (Signed)
Dr. Deretha Emory at bedside to explain results and plan of care (d/c to home with oral meds, followup with surgeon). Pt reports pain "is worse than it's ever been", crying and screaming in room after interaction with EDP. Mother at bedside screaming "no no no" at EDP and threatening to sue hospital, stating she is "going to call Dr. Preston Fleeting." Wanting patient to be admitted based on pain. States "you can't send somebody home who is on the floor crawling in pain." Charge RN and this RN deescalated family, plan to consult hospitalist for possible admission for pain control. Security present.

## 2021-02-20 NOTE — ED Notes (Signed)
Pt states pain is returning, Dr. Deretha Emory made aware.

## 2021-02-20 NOTE — ED Provider Notes (Signed)
[facility] EMERGENCY DEPARTMENT Provider Note   CSN: [id number] Arrival date & time: 02/20/21  1938     History Chief Complaint  Patient presents with  . Abdominal Pain    Jill Bass is a 35 y.o. female.  Patient status post gallbladder removal during admission April 10 through April 12.  Done by Dr. Derrell Lolling.  Patient did have gallstones.  Patient with acute onset of epigastric abdominal pain here today.  The onset of the pain was that 10:00 this morning.  Is been constant and severe.  Associated with some nausea but no vomiting.  Patient was actually pacing with the pain.  It is very similar to the pain that she had prior to having her gallbladder removed.  It was almost identical.        Past Medical History:  Diagnosis Date  . Kidney stones   . Migraine   . PCOS (polycystic ovarian syndrome)     Patient Active Problem List   Diagnosis Date Noted  . Intractable epigastric abdominal pain 02/20/2021  . Biliary colic 02/02/2021  . Gallstones 02/02/2021    Past Surgical History:  Procedure Laterality Date  . CHOLECYSTECTOMY N/A 02/03/2021   Procedure: LAPAROSCOPIC CHOLECYSTECTOMY WITH INTRAOPERATIVE CHOLANGIOGRAM;  Surgeon: Manus Rudd, MD;  Location: [facility] OR;  Service: General;  Laterality: N/A;  . TYMPANOSTOMY TUBE PLACEMENT       OB History   No obstetric history on file.     History reviewed. No pertinent family history.  Social History   Tobacco Use  . Smoking status: Never Smoker  . Smokeless tobacco: Never Used  Vaping Use  . Vaping Use: Never used  Substance Use Topics  . Alcohol use: Yes  . Drug use: No    Home Medications Prior to Admission medications   Medication Sig Start Date End Date Taking? Authorizing Provider  clobetasol ointment (TEMOVATE) 0.05 % Apply 1 application topically 2 (two) times daily. 10/27/20   [provider]  eletriptan (RELPAX) 40 MG tablet Take 40 mg by mouth every 2 (two) hours as needed for  migraine. may repeat in 2 hours if necessary migraine    [provider]  ibuprofen (ADVIL) 200 MG tablet Take 200 mg by mouth every 6 (six) hours as needed for mild pain.    [provider]  oxyCODONE (OXY IR/ROXICODONE) 5 MG immediate release tablet Take 1 tablet (5 mg total) by mouth every 6 (six) hours as needed for breakthrough pain. 02/04/21   Maczis, Elmer Sow, PA-C  simethicone (MYLICON) 125 MG chewable tablet Chew 125 mg by mouth every 6 (six) hours as needed. gas    [provider]    Allergies    Penicillins and Latex  Review of Systems   Review of Systems  Constitutional: Negative for chills and fever.  HENT: Negative for rhinorrhea and sore throat.   Eyes: Negative for visual disturbance.  Respiratory: Negative for cough and shortness of breath.   Cardiovascular: Negative for chest pain and leg swelling.  Gastrointestinal: Positive for abdominal pain and nausea. Negative for diarrhea and vomiting.  Genitourinary: Negative for dysuria.  Musculoskeletal: Negative for back pain and neck pain.  Skin: Negative for rash.  Neurological: Negative for dizziness, light-headedness and headaches.  Hematological: Does not bruise/bleed easily.  Psychiatric/Behavioral: Negative for confusion.    Physical Exam Updated Vital Signs BP (!) 132/103   Pulse (!) 151   Temp 97.8 F (36.6 C) (Oral)   Resp 16   Ht  1.575 m (5\' 2" )   Wt 115.2 kg   LMP 02/01/2021   SpO2 100%   BMI 46.46 kg/m   Physical Exam Vitals and nursing note reviewed.  Constitutional:      General: She is not in acute distress.    Appearance: Normal appearance. She is well-developed.  HENT:     Head: Normocephalic and atraumatic.  Eyes:     Conjunctiva/sclera: Conjunctivae normal.     Pupils: Pupils are equal, round, and reactive to light.  Cardiovascular:     Rate and Rhythm: Normal rate and regular rhythm.     Heart sounds: No murmur heard.   Pulmonary:     Effort: Pulmonary  effort is normal. No respiratory distress.     Breath sounds: Normal breath sounds.  Abdominal:     Palpations: Abdomen is soft.     Tenderness: There is no abdominal tenderness.  Musculoskeletal:     Cervical back: Normal range of motion and neck supple.  Skin:    General: Skin is warm and dry.     Capillary Refill: Capillary refill takes less than 2 seconds.  Neurological:     General: No focal deficit present.     Mental Status: She is alert and oriented to person, place, and time.     Cranial Nerves: No cranial nerve deficit.     Sensory: No sensory deficit.     Motor: No weakness.     ED Results / Procedures / Treatments   Labs (all labs ordered are listed, but only abnormal results are displayed) Labs Reviewed  CBC WITH DIFFERENTIAL/PLATELET - Abnormal; Notable for the following components:      Result Value   WBC 10.8 (*)    All other components within normal limits  COMPREHENSIVE METABOLIC PANEL - Abnormal; Notable for the following components:   Sodium 134 (*)    Glucose, Bld 116 (*)    AST 56 (*)    All other components within normal limits  CULTURE, BLOOD (ROUTINE X 2)  CULTURE, BLOOD (ROUTINE X 2)  LACTIC ACID, PLASMA  LIPASE, BLOOD  HCG, QUANTITATIVE, PREGNANCY  URINALYSIS, ROUTINE W REFLEX MICROSCOPIC    EKG EKG Interpretation  Date/Time:  Thursday February 20 2021 20:20:47 EDT Ventricular Rate:  103 PR Interval:  140 QRS Duration: 84 QT Interval:  351 QTC Calculation: 451 R Axis:   64 Text Interpretation: Sinus tachycardia Ventricular premature complex Confirmed by [id number] [phone]) on 02/20/2021 8:22:54 PM   Radiology CT Abdomen Pelvis W Contrast  Result Date: 02/20/2021 CLINICAL DATA:  Epigastric pain after gallbladder removal beginning of April. EXAM: CT ABDOMEN AND PELVIS WITH CONTRAST TECHNIQUE: Multidetector CT imaging of the abdomen and pelvis was performed using the standard protocol following bolus administration of intravenous  contrast. CONTRAST:  May OMNIPAQUE IOHEXOL 300 MG/ML  SOLN COMPARISON:  02/02/2021 FINDINGS: Lower chest: The lung bases are clear. Hepatobiliary: Surgical absence of the gallbladder. Small amount of fluid and stranding in the gallbladder fossa is likely postoperative. No loculated collection. No bile duct dilatation. No focal liver lesions. Pancreas: Unremarkable. No pancreatic ductal dilatation or surrounding inflammatory changes. Spleen: Normal in size without focal abnormality. Adrenals/Urinary Tract: Adrenal glands are unremarkable. Kidneys are normal, without renal calculi, focal lesion, or hydronephrosis. Bladder is unremarkable. Stomach/Bowel: The stomach, small bowel, and colon are not abnormally distended. Scattered diverticula in the colon. No evidence of acute diverticulitis. Appendix is normal. Vascular/Lymphatic: No significant vascular findings are present. No enlarged abdominal or pelvic lymph nodes.  Reproductive: Uterus and ovaries are not enlarged. Involuting cyst in the right ovary. Other: No free air or free fluid in the abdomen. Minimal periumbilical hernia containing fat. Musculoskeletal: No acute or significant osseous findings. IMPRESSION: 1. Surgical absence of the gallbladder. Small amount of fluid and stranding in the gallbladder fossa is likely postoperative. No loculated collection. 2. No evidence of bowel obstruction or inflammation. 3. Involuting cyst in the right ovary. 4. Minimal periumbilical hernia containing fat. Electronically Signed   By: Burman Nieves M.D.   On: 02/20/2021 21:41   DG Chest Port 1 View  Result Date: 02/20/2021 CLINICAL DATA:  Epigastric pain.  Cholecystectomy 2 weeks ago. EXAM: PORTABLE CHEST 1 VIEW COMPARISON:  None. FINDINGS: The cardiomediastinal contours are normal. The lungs are clear. Pulmonary vasculature is normal. No consolidation, pleural effusion, or pneumothorax. No acute osseous abnormalities are seen. IMPRESSION: Negative portable view of  the chest. Electronically Signed   By: Narda Rutherford M.D.   On: 02/20/2021 20:34    Procedures Procedures   Medications Ordered in ED Medications  0.9 %  sodium chloride infusion ( Intravenous New Bag/Given 02/20/21 2142)  ondansetron (ZOFRAN) injection 4 mg (4 mg Intravenous Given 02/20/21 2019)  HYDROmorphone (DILAUDID) injection 1 mg (1 mg Intravenous Given 02/20/21 2019)  sodium chloride 0.9 % bolus 1,000 mL (0 mLs Intravenous Stopped 02/20/21 2117)  pantoprazole (PROTONIX) injection 40 mg (40 mg Intravenous Given 02/20/21 2019)  iohexol (OMNIPAQUE) 300 MG/ML solution 100 mL (100 mLs Intravenous Contrast Given 02/20/21 2118)  HYDROmorphone (DILAUDID) injection 1 mg (1 mg Intravenous Given 02/20/21 2215)    ED Course  I have reviewed the triage vital signs and the nursing notes.  Pertinent labs & imaging results that were available during my care of the patient were reviewed by me and considered in my medical decision making (see chart for details).    MDM Rules/Calculators/A&P                          Work-up for the epigastric abdominal pain without any significant findings.  White blood cell count was 10.8.   LFTs without any abnormalities.  Lipase is normal.  Renal functions normal.  Chest x-ray negative.  CT scan of the abdomen with just a little bit of fluid in the gallbladder fossa with no other significant abnormalities.  Patient's pain improved significantly here with 2 doses of hydromorphone.  And patient also given the Protonix.   Patient was very comfortable.  And then when told that liver work-up was negative.  Her and her mother got extremely irate saying that there is something very wrong here that is being missed and that she needs to be admitted.  Talk to them about no strong indications for admission and that the best I could do would be to talk to the hospitalist to see if they would be in agreement with the admission.  Spoke with Dr. Robb Matar on the phone.  I had a  long conversation.  Told him that the family was very upset.  He is going to go ahead and admit for intractable abdominal pain.  And will have general surgery consult on her.  Patient will be admitted to [facility].  We will go ahead and reorder some more pain medicine now. Final Clinical Impression(s) / ED Diagnoses Final diagnoses:  Epigastric pain    Rx / DC Orders ED Discharge Orders    None       Dorothyann Mourer,  Lorin PicketScott, MD 02/20/21 2325

## 2021-02-20 NOTE — ED Triage Notes (Signed)
Pt states had gall bladder removed 2 weeks ago, started to have pain this morn ing at 10 am, pain has increased in the last 10 min's from a 6- to a 10.

## 2021-02-21 ENCOUNTER — Encounter (HOSPITAL_COMMUNITY): Payer: Self-pay | Admitting: Internal Medicine

## 2021-02-21 ENCOUNTER — Observation Stay (HOSPITAL_COMMUNITY): Payer: Managed Care, Other (non HMO)

## 2021-02-21 DIAGNOSIS — R7989 Other specified abnormal findings of blood chemistry: Secondary | ICD-10-CM | POA: Diagnosis not present

## 2021-02-21 DIAGNOSIS — R109 Unspecified abdominal pain: Secondary | ICD-10-CM | POA: Diagnosis present

## 2021-02-21 DIAGNOSIS — R1013 Epigastric pain: Secondary | ICD-10-CM

## 2021-02-21 DIAGNOSIS — Z9049 Acquired absence of other specified parts of digestive tract: Secondary | ICD-10-CM | POA: Diagnosis not present

## 2021-02-21 DIAGNOSIS — K2970 Gastritis, unspecified, without bleeding: Secondary | ICD-10-CM | POA: Diagnosis not present

## 2021-02-21 DIAGNOSIS — K219 Gastro-esophageal reflux disease without esophagitis: Secondary | ICD-10-CM | POA: Diagnosis not present

## 2021-02-21 DIAGNOSIS — E871 Hypo-osmolality and hyponatremia: Secondary | ICD-10-CM | POA: Diagnosis not present

## 2021-02-21 DIAGNOSIS — Z20822 Contact with and (suspected) exposure to covid-19: Secondary | ICD-10-CM | POA: Diagnosis not present

## 2021-02-21 DIAGNOSIS — Z880 Allergy status to penicillin: Secondary | ICD-10-CM | POA: Diagnosis not present

## 2021-02-21 DIAGNOSIS — Z91040 Latex allergy status: Secondary | ICD-10-CM | POA: Diagnosis not present

## 2021-02-21 DIAGNOSIS — Z6841 Body Mass Index (BMI) 40.0 and over, adult: Secondary | ICD-10-CM | POA: Diagnosis not present

## 2021-02-21 DIAGNOSIS — Z79899 Other long term (current) drug therapy: Secondary | ICD-10-CM | POA: Diagnosis not present

## 2021-02-21 DIAGNOSIS — N83201 Unspecified ovarian cyst, right side: Secondary | ICD-10-CM | POA: Diagnosis not present

## 2021-02-21 DIAGNOSIS — K2210 Ulcer of esophagus without bleeding: Secondary | ICD-10-CM | POA: Diagnosis not present

## 2021-02-21 LAB — CBC WITH DIFFERENTIAL/PLATELET
Abs Immature Granulocytes: 0.04 10*3/uL (ref 0.00–0.07)
Basophils Absolute: 0 10*3/uL (ref 0.0–0.1)
Basophils Relative: 0 %
Eosinophils Absolute: 0 10*3/uL (ref 0.0–0.5)
Eosinophils Relative: 0 %
HCT: 37.8 % (ref 36.0–46.0)
Hemoglobin: 12.1 g/dL (ref 12.0–15.0)
Immature Granulocytes: 1 %
Lymphocytes Relative: 10 %
Lymphs Abs: 0.9 10*3/uL (ref 0.7–4.0)
MCH: 27.9 pg (ref 26.0–34.0)
MCHC: 32 g/dL (ref 30.0–36.0)
MCV: 87.1 fL (ref 80.0–100.0)
Monocytes Absolute: 0.4 10*3/uL (ref 0.1–1.0)
Monocytes Relative: 5 %
Neutro Abs: 7.3 10*3/uL (ref 1.7–7.7)
Neutrophils Relative %: 84 %
Platelets: 243 10*3/uL (ref 150–400)
RBC: 4.34 MIL/uL (ref 3.87–5.11)
RDW: 13.4 % (ref 11.5–15.5)
WBC: 8.7 10*3/uL (ref 4.0–10.5)
nRBC: 0 % (ref 0.0–0.2)

## 2021-02-21 LAB — BASIC METABOLIC PANEL
Anion gap: 10 (ref 5–15)
BUN: 10 mg/dL (ref 6–20)
CO2: 21 mmol/L — ABNORMAL LOW (ref 22–32)
Calcium: 8.6 mg/dL — ABNORMAL LOW (ref 8.9–10.3)
Chloride: 105 mmol/L (ref 98–111)
Creatinine, Ser: 0.7 mg/dL (ref 0.44–1.00)
GFR, Estimated: >60 mL/min (ref >60)
Glucose, Bld: 140 mg/dL — ABNORMAL HIGH (ref 70–99)
Potassium: 4.3 mmol/L (ref 3.5–5.1)
Sodium: 136 mmol/L (ref 135–145)

## 2021-02-21 LAB — RESP PANEL BY RT-PCR (FLU A&B, COVID) ARPGX2
Influenza A by PCR: NEGATIVE
Influenza B by PCR: NEGATIVE
SARS Coronavirus 2 by RT PCR: NEGATIVE

## 2021-02-21 LAB — HIV ANTIBODY (ROUTINE TESTING W REFLEX): HIV Screen 4th Generation wRfx: NONREACTIVE

## 2021-02-21 LAB — HEPATIC FUNCTION PANEL
ALT: 253 U/L — ABNORMAL HIGH (ref 0–44)
AST: 376 U/L — ABNORMAL HIGH (ref 15–41)
Albumin: 3.8 g/dL (ref 3.5–5.0)
Alkaline Phosphatase: 103 U/L (ref 38–126)
Bilirubin, Direct: 0.7 mg/dL — ABNORMAL HIGH (ref 0.0–0.2)
Indirect Bilirubin: 0.7 mg/dL (ref 0.3–0.9)
Total Bilirubin: 1.4 mg/dL — ABNORMAL HIGH (ref 0.3–1.2)
Total Protein: 7.1 g/dL (ref 6.5–8.1)

## 2021-02-21 IMAGING — MR MR ABDOMEN WO/W CM MRCP
16 of 20 series · 37 of 48 positions shown · IV contrast (10 GADAVIST)
Comparison: [DATE] CT evaluation.

CLINICAL DATA: Jaundice with elevated liver function tests in a
34-year-old female post cholecystectomy.

EXAM:
MRI ABDOMEN WITHOUT AND WITH CONTRAST (INCLUDING MRCP)
TECHNIQUE: Multiplanar multisequence MR imaging of the abdomen was performed
both before and after the administration of intravenous contrast.
Heavily T2-weighted images of the biliary and pancreatic ducts were
obtained, and three-dimensional MRCP images were rendered by post
processing.
CONTRAST:  10mL GADAVIST GADOBUTROL 1 MMOL/ML IV SOLN

[Series 3: T2 fat-sat · axial · 6.0mm · 1.25mm/px · 1 of 36 slices shown]
[im 1/36]
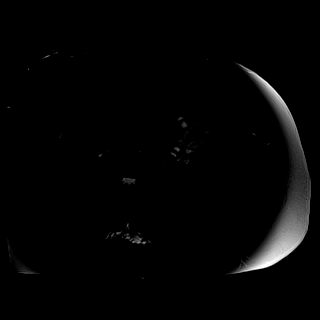

[Series 6: T2 · coronal · 6.0mm · 1.48mm/px · 1 of 30 slices shown (1 of 2)]
[im 1/30]
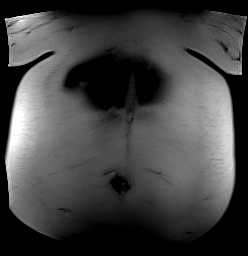

[Series 7: DWI · axial · 6.0mm · 1.49mm/px · z∈[-255,+26]mm · 3 of 80 slices shown (1 of 2)]
[im 1/80]
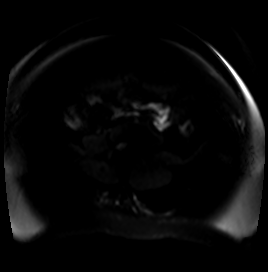
[im 40/80]
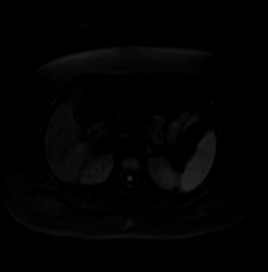
[im 80/80]
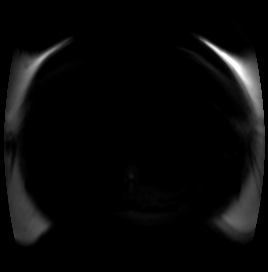

[Series 8: DWI · axial · 6.0mm · 1.49mm/px · 1 of 40 slices shown (2 of 2)]
[im 1/40]
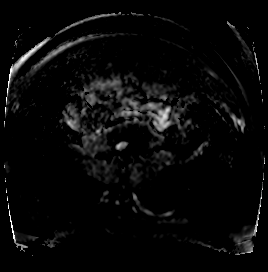

[Series 9: T1 · axial · 3.0mm · 1.31mm/px · z∈[-268,-55]mm · 3 of 72 slices shown (1 of 2)]
[im 1/72]
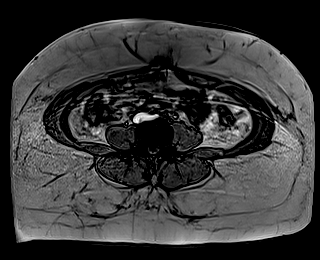
[im 36/72]
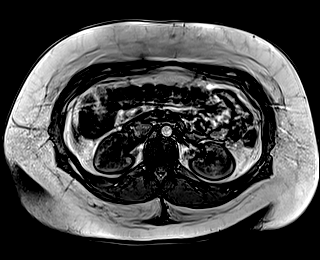
[im 72/72]
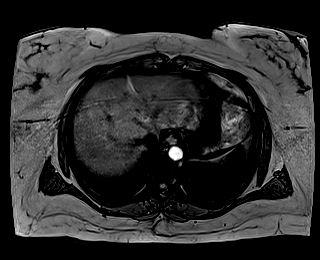

[Series 10: T1 · axial · 3.0mm · 1.31mm/px · z∈[-268,-55]mm · 3 of 72 slices shown (2 of 2)]
[im 1/72]
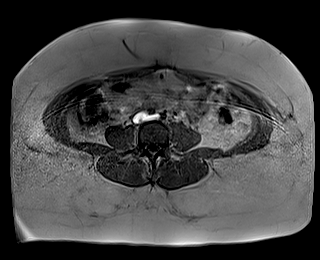
[im 36/72]
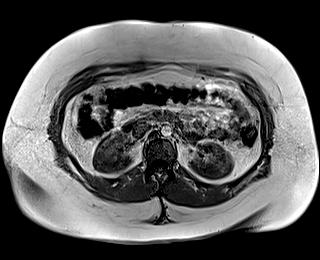
[im 72/72]
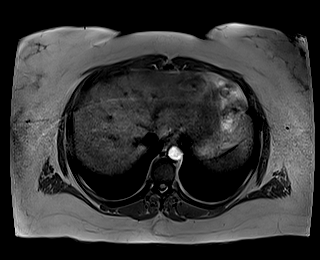

[Series 13: cor_3d_spc_trig · coronal · 1.0mm · 0.49mm/px · 3 of 72 slices shown]
[im 1/72]
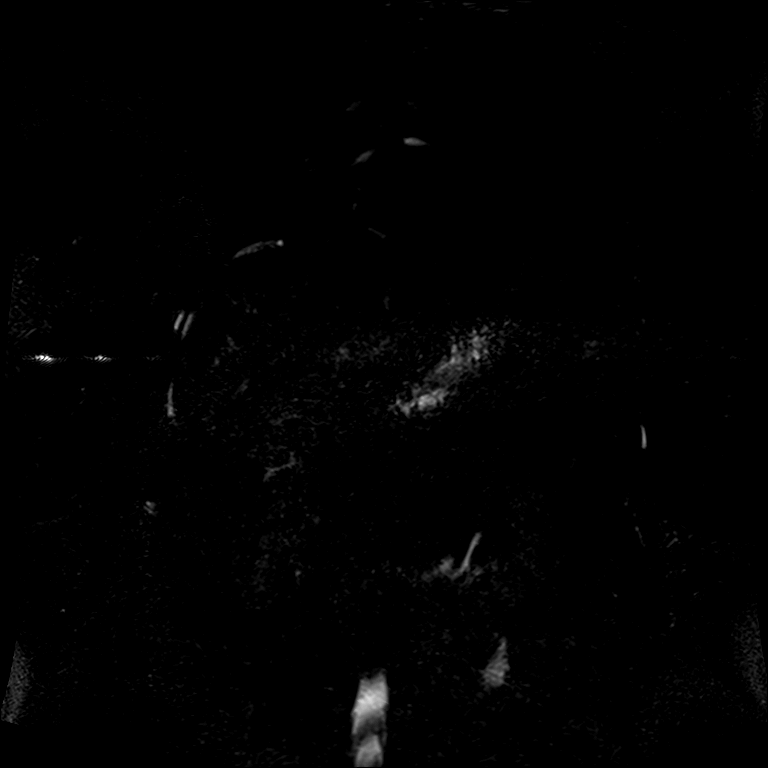
[im 36/72]
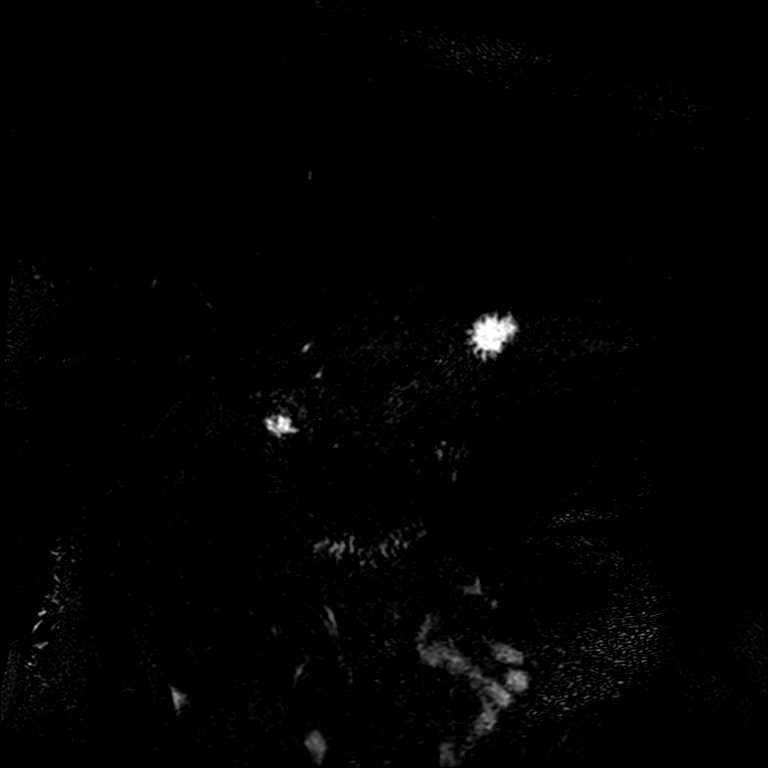
[im 72/72]
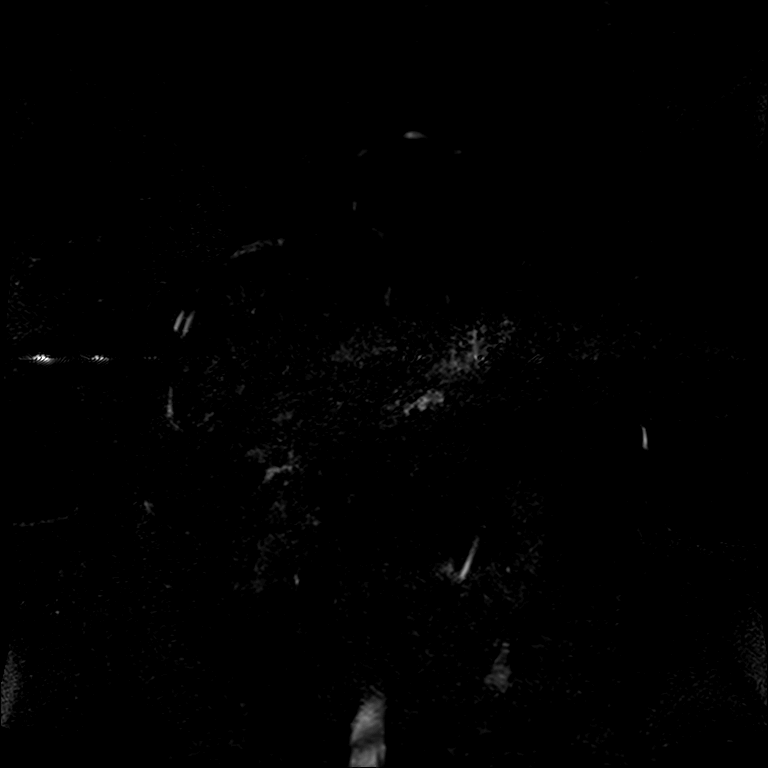

[Series 15: cor obl thk · sagittal · 50.0mm · 0.78mm/px · 1 of 9 slices shown]
[im 1/9]
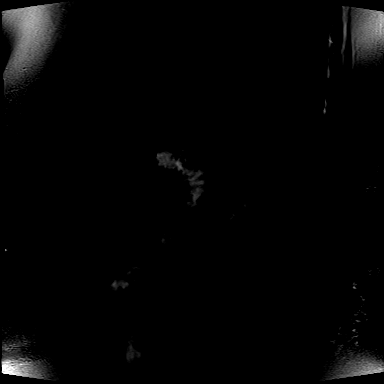

[Series 16: T2 · axial · 6.0mm · 1.64mm/px · 1 of 30 slices shown (2 of 2)]
[im 1/30]
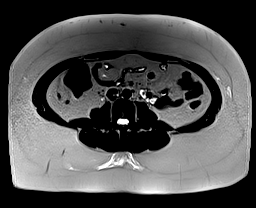

[Series 18: T1 dynamic · axial · 3.0mm · 1.25mm/px · z∈[-251,-14]mm · 3 of 80 slices shown (1 of 7)]
[im 1/80]
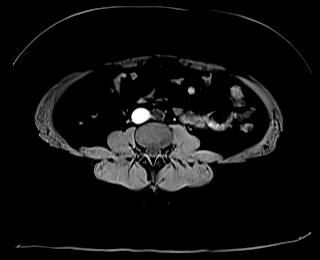
[im 40/80]
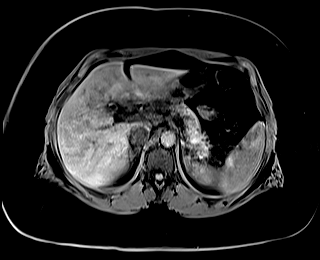
[im 80/80]
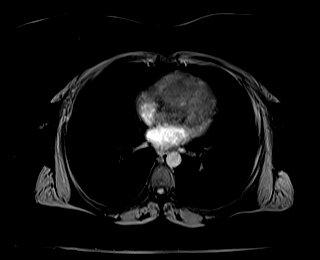

[Series 22: T1 dynamic · axial · 3.0mm · 1.25mm/px · z∈[-251,-14]mm · 3 of 80 slices shown (2 of 7)]
[im 1/80]
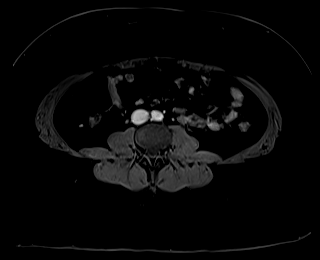
[im 40/80]
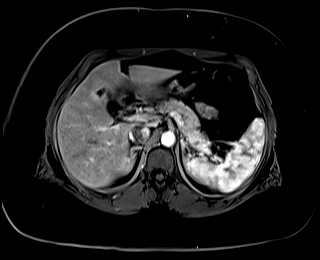
[im 80/80]
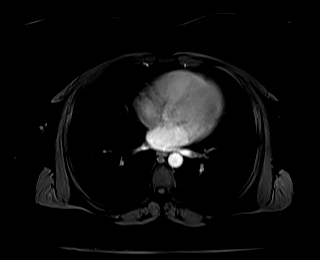

[Series 23: T1 dynamic · axial · 3.0mm · 1.25mm/px · z∈[-251,-14]mm · 3 of 80 slices shown (3 of 7)]
[im 1/80]
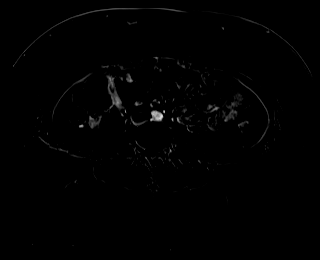
[im 40/80]
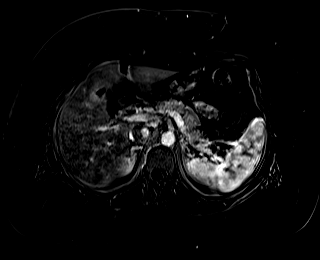
[im 80/80]
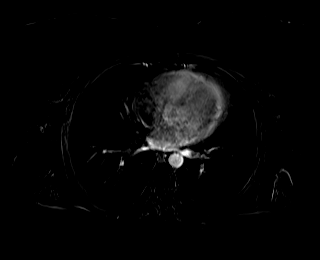

[Series 27: T1 dynamic · axial · 3.0mm · 1.25mm/px · z∈[-251,-14]mm · 3 of 80 slices shown (4 of 7)]
[im 1/80]
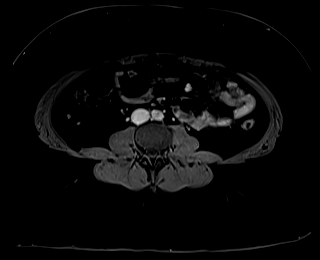
[im 40/80]
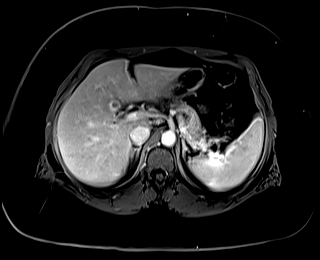
[im 80/80]
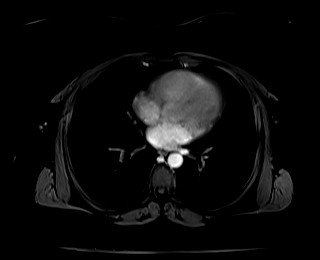

[Series 28: T1 dynamic · axial · 3.0mm · 1.25mm/px · z∈[-251,-14]mm · 3 of 80 slices shown (5 of 7)]
[im 1/80]
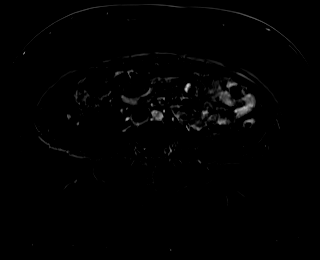
[im 40/80]
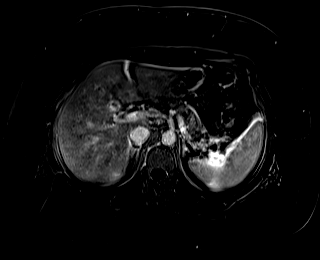
[im 80/80]
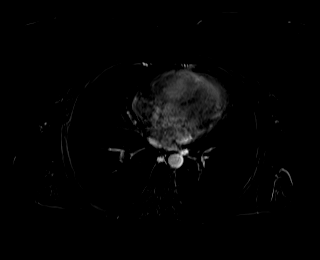

[Series 31: T1 dynamic · axial · 3.0mm · 1.25mm/px · z∈[-251,-14]mm · 3 of 80 slices shown (6 of 7)]
[im 1/80]
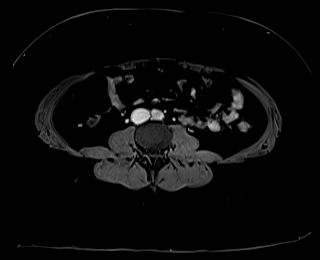
[im 40/80]
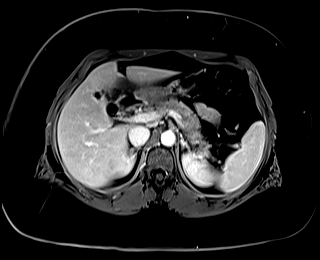
[im 80/80]
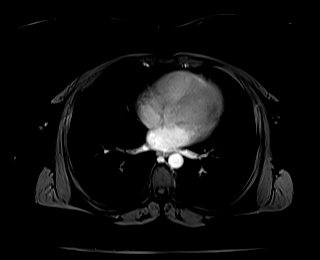

[Series 32: T1 dynamic · axial · 3.0mm · 1.25mm/px · z∈[-251,-134]mm · 2 of 80 slices shown (7 of 7)]
[im 1/80]
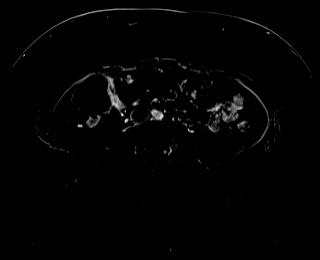
[im 40/80]
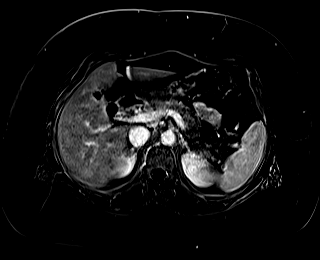

[37 of 48 positions shown; findings below may reference images not displayed]

FINDINGS: Lower chest: Limited assessment of the lung bases by MRI. No gross
effusion or sign of consolidation.

Hepatobiliary: Biliary tree nondilated with changes of
cholecystectomy. No filling defect in the common bile duct. Small
amount of fluid in the gallbladder fossa and small amount of
stranding about the area of postoperative change. Fluid measures 10
x 7 mm and is unchanged compared to the recent CT evaluation.

No focal, suspicious hepatic lesion.

Pancreas: Normal, without mass, inflammation or ductal dilatation.
Normal intrinsic T1 signal. No signs of pancreatic divisum.

Spleen: Spleen with normal size and contour. The no suspicious
splenic lesion.

Adrenals/Urinary Tract: Adrenal glands are normal. Symmetric renal
enhancement. No hydronephrosis.

Stomach/Bowel: Gastrointestinal tract unremarkable to the extent
evaluated on abdominal MRI with limited assessment.

Vascular/Lymphatic: Abdominal aorta is normal caliber. Smooth
contour of the IVC. There is no gastrohepatic or hepatoduodenal
ligament lymphadenopathy. No retroperitoneal or mesenteric
lymphadenopathy.

Other: Trace perihepatic ascites. Trace fluid may be present along
the RIGHT pericolic gutter.

Musculoskeletal: No suspicious bone lesions identified.
IMPRESSION: 1. Status post cholecystectomy. No biliary ductal dilatation or
choledocholithiasis.
2. Small amount of fluid in the gallbladder fossa and small amount
of stranding about the area of postoperative change. This is
unchanged compared to the recent CT evaluation.
3. Trace perihepatic ascites. Trace fluid may also be present along
the RIGHT pericolic gutter. Findings are nonspecific. If there is
clinical concern for biliary leak nuclear medicine study may be
helpful. Volume of fluid is very small in remains nonspecific.

## 2021-02-21 IMAGING — MR MR 3D RECON AT SCANNER
16 of 20 series · 37 of 48 positions shown · IV contrast (gadavist)
Comparison: [DATE] CT evaluation.

:
CLINICAL DATA: Jaundice with elevated liver function tests in a
34-year-old female post cholecystectomy.

EXAM:
MRI ABDOMEN WITHOUT AND WITH CONTRAST (INCLUDING MRCP)
TECHNIQUE: Multiplanar multisequence MR imaging of the abdomen was performed
both before and after the administration of intravenous contrast.
Heavily T2-weighted images of the biliary and pancreatic ducts were
obtained, and three-dimensional MRCP images were rendered by post
processing.
CONTRAST: 10mL GADAVIST GADOBUTROL 1 MMOL/ML IV SOLN

[Series 3: T2 fat-sat · axial · 6.0mm · 1.25mm/px · 1 of 36 slices shown]
[im 1/36]
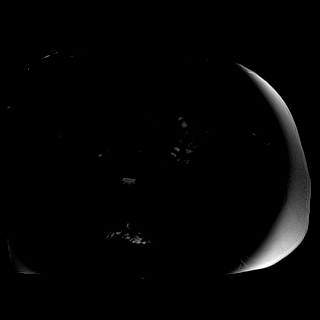

[Series 6: T2 · coronal · 6.0mm · 1.48mm/px · 1 of 30 slices shown (1 of 2)]
[im 1/30]
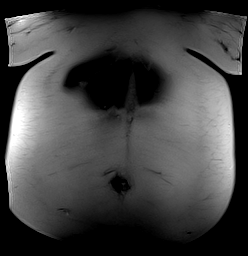

[Series 7: DWI · axial · 6.0mm · 1.49mm/px · z∈[-255,+26]mm · 3 of 80 slices shown (1 of 2)]
[im 1/80]
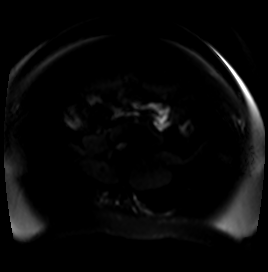
[im 40/80]
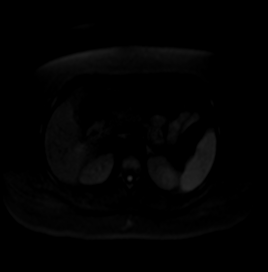
[im 80/80]
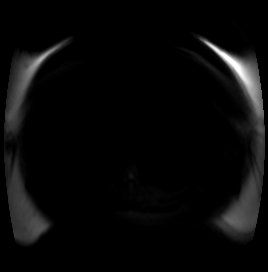

[Series 8: DWI · axial · 6.0mm · 1.49mm/px · 1 of 40 slices shown (2 of 2)]
[im 1/40]
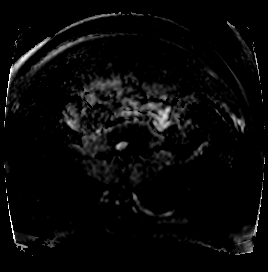

[Series 9: T1 · axial · 3.0mm · 1.31mm/px · z∈[-268,-55]mm · 3 of 72 slices shown (1 of 2)]
[im 1/72]
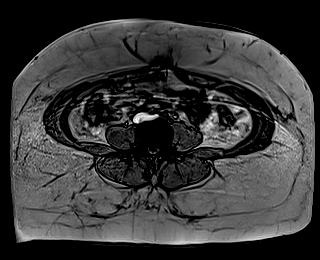
[im 36/72]
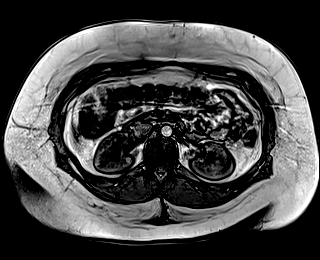
[im 72/72]
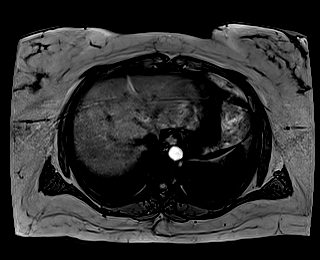

[Series 10: T1 · axial · 3.0mm · 1.31mm/px · z∈[-268,-55]mm · 3 of 72 slices shown (2 of 2)]
[im 1/72]
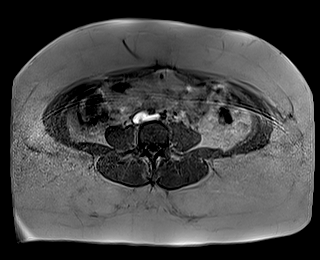
[im 36/72]
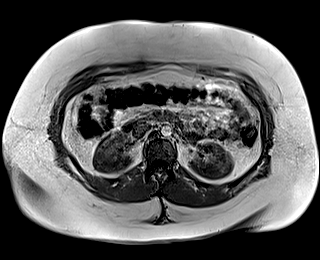
[im 72/72]
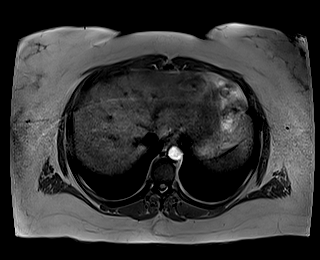

[Series 13: cor_3d_spc_trig · coronal · 1.0mm · 0.49mm/px · 3 of 72 slices shown]
[im 1/72]
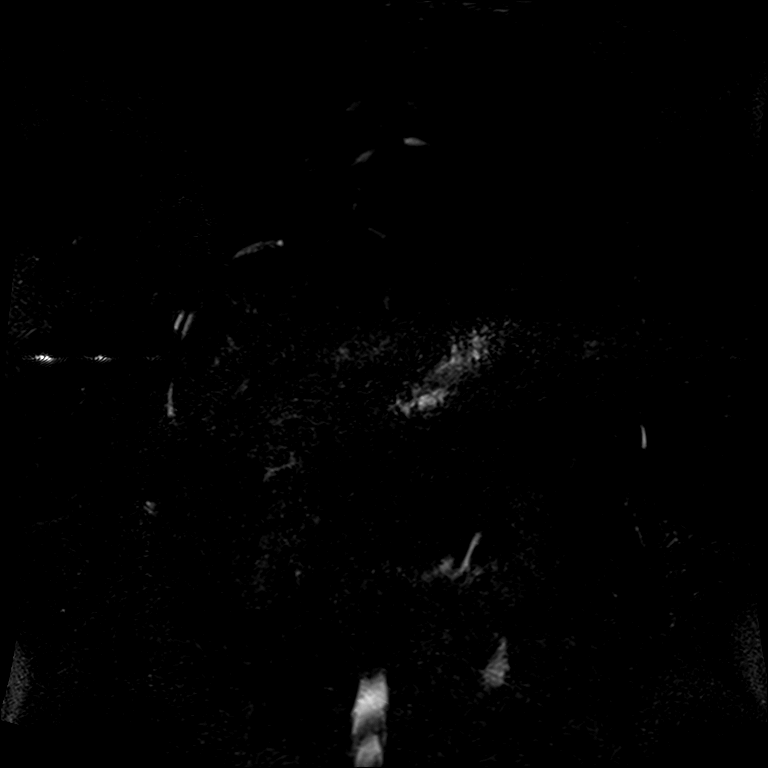
[im 36/72]
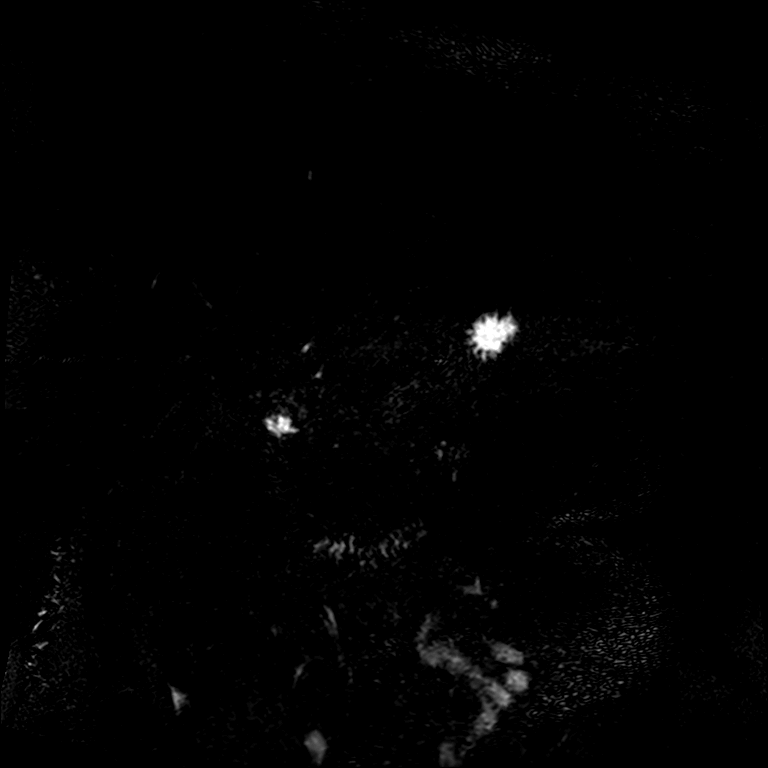
[im 72/72]
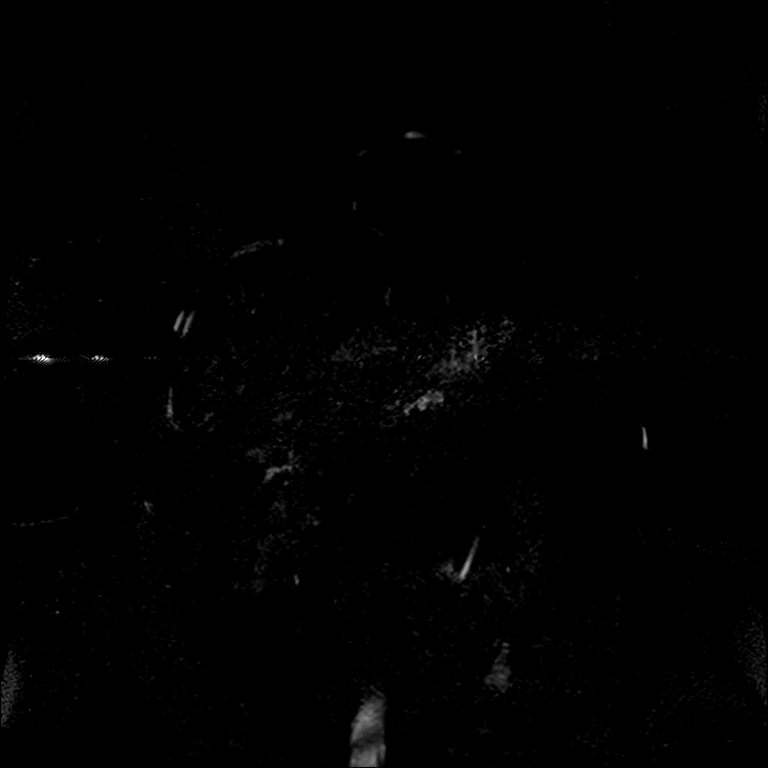

[Series 15: cor obl thk · sagittal · 50.0mm · 0.78mm/px · 1 of 9 slices shown]
[im 1/9]
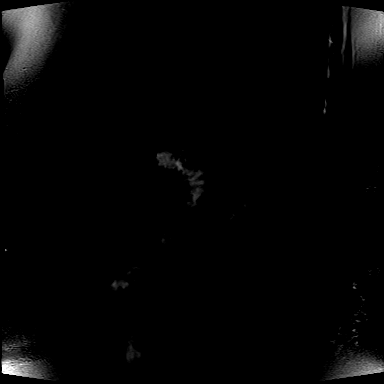

[Series 16: T2 · axial · 6.0mm · 1.64mm/px · 1 of 30 slices shown (2 of 2)]
[im 1/30]
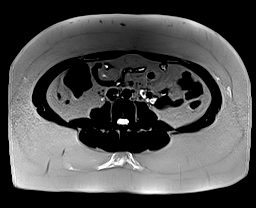

[Series 18: T1 dynamic · axial · 3.0mm · 1.25mm/px · z∈[-251,-14]mm · 3 of 80 slices shown (1 of 7)]
[im 1/80]
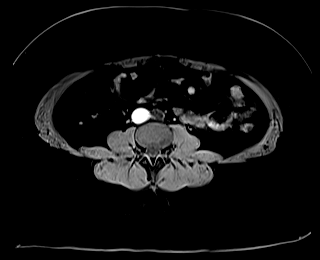
[im 40/80]
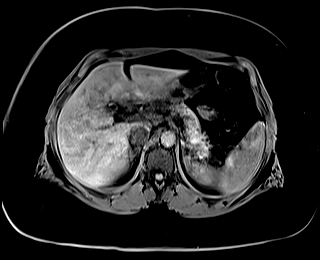
[im 80/80]
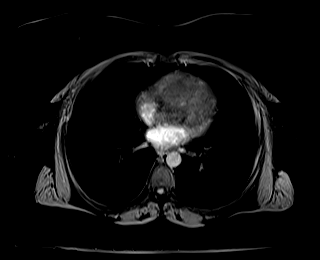

[Series 22: T1 dynamic · axial · 3.0mm · 1.25mm/px · z∈[-251,-14]mm · 3 of 80 slices shown (2 of 7)]
[im 1/80]
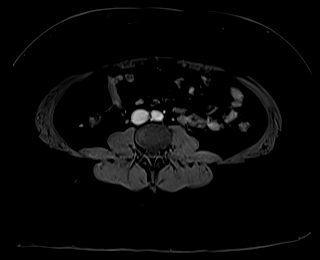
[im 40/80]
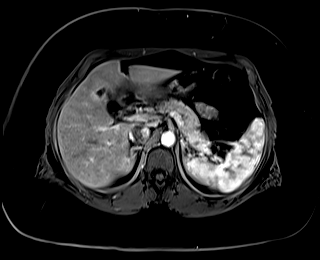
[im 80/80]
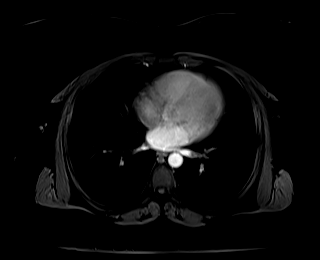

[Series 23: T1 dynamic · axial · 3.0mm · 1.25mm/px · z∈[-251,-14]mm · 3 of 80 slices shown (3 of 7)]
[im 1/80]
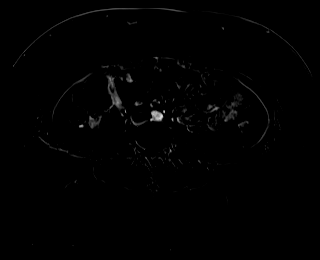
[im 40/80]
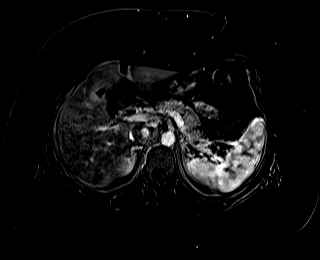
[im 80/80]
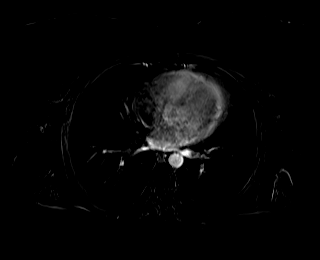

[Series 27: T1 dynamic · axial · 3.0mm · 1.25mm/px · z∈[-251,-14]mm · 3 of 80 slices shown (4 of 7)]
[im 1/80]
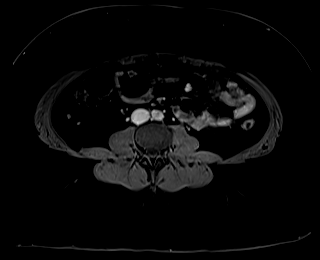
[im 40/80]
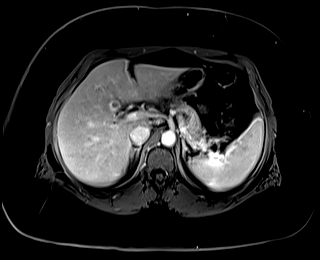
[im 80/80]
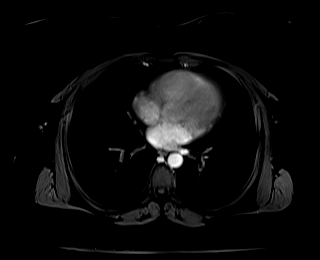

[Series 28: T1 dynamic · axial · 3.0mm · 1.25mm/px · z∈[-251,-14]mm · 3 of 80 slices shown (5 of 7)]
[im 1/80]
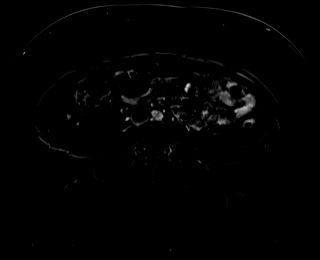
[im 40/80]
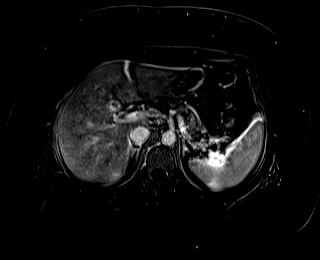
[im 80/80]
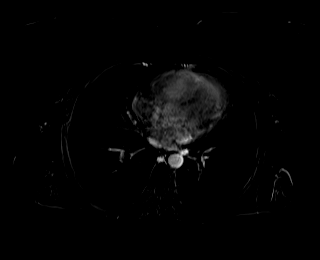

[Series 31: T1 dynamic · axial · 3.0mm · 1.25mm/px · z∈[-251,-14]mm · 3 of 80 slices shown (6 of 7)]
[im 1/80]
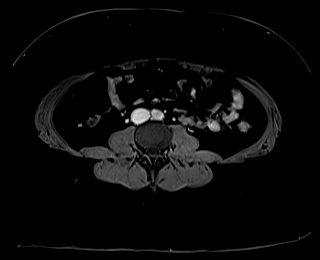
[im 40/80]
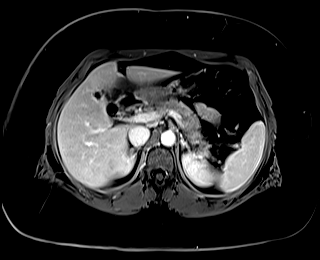
[im 80/80]
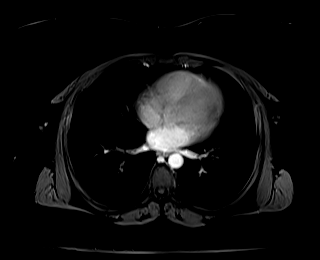

[Series 32: T1 dynamic · axial · 3.0mm · 1.25mm/px · z∈[-251,-134]mm · 2 of 80 slices shown (7 of 7)]
[im 1/80]
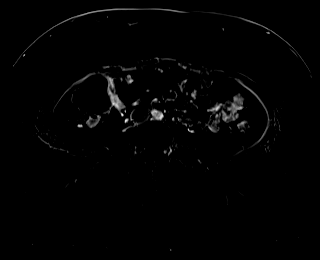
[im 40/80]
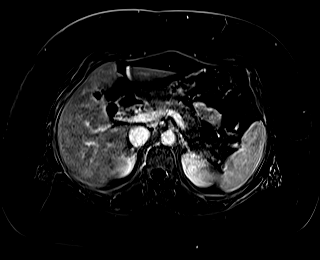

[37 of 48 positions shown; findings below may reference images not displayed]

FINDINGS: Lower chest: Limited assessment of the lung bases by MRI. No gross
effusion or sign of consolidation.

Hepatobiliary: Biliary tree nondilated with changes of
cholecystectomy. No filling defect in the common bile duct. Small
amount of fluid in the gallbladder fossa and small amount of
stranding about the area of postoperative change. Fluid measures 10
x 7 mm and is unchanged compared to the recent CT evaluation.

No focal, suspicious hepatic lesion.

Pancreas: Normal, without mass, inflammation or ductal dilatation.
Normal intrinsic T1 signal. No signs of pancreatic divisum.

Spleen: Spleen with normal size and contour. The no suspicious
splenic lesion.

Adrenals/Urinary Tract: Adrenal glands are normal. Symmetric renal
enhancement. No hydronephrosis.

Stomach/Bowel: Gastrointestinal tract unremarkable to the extent
evaluated on abdominal MRI with limited assessment.

Vascular/Lymphatic: Abdominal aorta is normal caliber. Smooth
contour of the IVC. There is no gastrohepatic or hepatoduodenal
ligament lymphadenopathy. No retroperitoneal or mesenteric
lymphadenopathy.

Other: Trace perihepatic ascites. Trace fluid may be present along
the RIGHT pericolic gutter.

Musculoskeletal: No suspicious bone lesions identified.
IMPRESSION: 1. Status post cholecystectomy. No biliary ductal dilatation or
choledocholithiasis.
2. Small amount of fluid in the gallbladder fossa and small amount
of stranding about the area of postoperative change. This is
unchanged compared to the recent CT evaluation.
3. Trace perihepatic ascites. Trace fluid may also be present along
the RIGHT pericolic gutter. Findings are nonspecific. If there is
clinical concern for biliary leak nuclear medicine study may be
helpful. Volume of fluid is very small in remains nonspecific.

This was performed with the examination [DATE]. The report is
added above.

## 2021-02-21 MED ORDER — SODIUM CHLORIDE 0.9 % IV SOLN
12.5000 mg | Freq: Four times a day (QID) | INTRAVENOUS | Status: DC | PRN
  Administered 2021-02-21: 12.5 mg via INTRAVENOUS
  Filled 2021-02-21: qty 12.5

## 2021-02-21 MED ORDER — DEXTROSE IN LACTATED RINGERS 5 % IV SOLN: INTRAVENOUS | Status: AC

## 2021-02-21 MED ORDER — ONDANSETRON HCL 4 MG/2ML IJ SOLN
4.0000 mg | Freq: Four times a day (QID) | INTRAMUSCULAR | Status: DC | PRN
  Administered 2021-02-21 – 2021-02-22 (×3): 4 mg via INTRAVENOUS
  Filled 2021-02-21 (×3): qty 2

## 2021-02-21 MED ORDER — HYDROMORPHONE HCL 1 MG/ML IJ SOLN
0.5000 mg | INTRAMUSCULAR | Status: DC | PRN
  Administered 2021-02-22: 0.5 mg via INTRAVENOUS
  Filled 2021-02-21: qty 0.5

## 2021-02-21 MED ORDER — GADOBUTROL 1 MMOL/ML IV SOLN
10.0000 mL | Freq: Once | INTRAVENOUS | Status: AC | PRN
  Administered 2021-02-21: 10 mL via INTRAVENOUS

## 2021-02-21 MED ORDER — METOCLOPRAMIDE HCL 5 MG/ML IJ SOLN
10.0000 mg | Freq: Once | INTRAMUSCULAR | Status: AC
  Administered 2021-02-21: 10 mg via INTRAVENOUS
  Filled 2021-02-21: qty 2

## 2021-02-21 MED ORDER — HYDROMORPHONE HCL 1 MG/ML IJ SOLN
1.0000 mg | Freq: Once | INTRAMUSCULAR | Status: AC
  Administered 2021-02-21: 1 mg via INTRAVENOUS
  Filled 2021-02-21: qty 1

## 2021-02-21 NOTE — H&P (Signed)
History and Physical    Jill Bass JYN:[id number] DOB: 01/23/1986 DOA: 02/20/2021  PCP: Laurann MontanaWhite, Cynthia, MD  Patient coming from: Home.  Chief Complaint: Abdominal pain.  HPI: Jill Bass is a 35 y.o. female with no significant past medical history presents to the ER with complaints of abdominal pain.  Patient had a cholecystectomy on February 03, 2021 about 2 weeks ago and since then has been doing fine.  Yesterday morning patient started having epigastric discomfort which progressively got worse through the day.  Had some nausea no vomiting or diarrhea.  Denies any fever or chills.  Pain is stabbing in nature and decided to come to the ER.  ED Course: CT abdomen pelvis done in the ER shows postoperative changes in the gallbladder fossa and involuting cyst of the right ovary.  Labs are significant for AST of 56 total bilirubin is 1.0 WBC 10.8 patient was afebrile COVID test negative.  Sodium 134.  Given the persistent pain patient admitted for further observation.  Review of Systems: As per HPI, rest all negative.   Past Medical History:  Diagnosis Date  . Kidney stones   . Migraine   . PCOS (polycystic ovarian syndrome)     Past Surgical History:  Procedure Laterality Date  . CHOLECYSTECTOMY N/A 02/03/2021   Procedure: LAPAROSCOPIC CHOLECYSTECTOMY WITH INTRAOPERATIVE CHOLANGIOGRAM;  Surgeon: Manus Ruddsuei, Matthew, MD;  [facility];  Service: General;  Laterality: N/A;  . TYMPANOSTOMY TUBE PLACEMENT       reports that she has never smoked. She has never used smokeless tobacco. She reports current alcohol use. She reports that she does not use drugs.  Allergies  Allergen Reactions  . Penicillins Hives  . Latex Rash    History reviewed. No pertinent family history.  Prior to Admission medications   Medication Sig Start Date End Date Taking? Authorizing Provider  clobetasol ointment (TEMOVATE) 0.05 % Apply 1 application topically 2 (two) times daily. 10/27/20   [provider]  eletriptan (RELPAX) 40 MG tablet Take 40 mg by mouth every 2 (two) hours as needed for migraine. may repeat in 2 hours if necessary migraine    [provider]  ibuprofen (ADVIL) 200 MG tablet Take 200 mg by mouth every 6 (six) hours as needed for mild pain.    [provider]  oxyCODONE (OXY IR/ROXICODONE) 5 MG immediate release tablet Take 1 tablet (5 mg total) by mouth every 6 (six) hours as needed for breakthrough pain. 02/04/21   Maczis, Elmer SowMichael M, PA-C  simethicone (MYLICON) 125 MG chewable tablet Chew 125 mg by mouth every 6 (six) hours as needed. gas    [provider]    Physical Exam: Constitutional: Moderately built and nourished. Vitals:   02/20/21 2300 02/20/21 2335 02/21/21 0100 02/21/21 0209  BP: (!) 132/103  134/88 124/76  Pulse: (!) 151 87 82 (!) 56  Resp: 16 20 15 17   Temp:   (!) 97.5 F (36.4 C)   TempSrc:      SpO2: 100% 100% 99% 94%  Weight:      Height:       Eyes: Anicteric no pallor. ENMT: No discharge from the ears eyes nose and mouth. Neck: No mass felt.  No neck rigidity. Respiratory: No rhonchi or crepitations. Cardiovascular: S1-S2 heard. Abdomen: Mild epigastric tenderness no guarding or rigidity. Musculoskeletal: No edema. Skin: No rash. Neurologic: Alert awake oriented to time place and person.  Moves all extremities. Psychiatric: Appears normal.  Normal affect.  Labs on Admission: I have personally reviewed following labs and imaging studies  CBC: Recent Labs  Lab 02/20/21 2011  WBC 10.8*  NEUTROABS 6.2  HGB 13.1  HCT 38.5  MCV 83.3  PLT 270   Basic Metabolic Panel: Recent Labs  Lab 02/20/21 2011  NA 134*  K 3.5  CL 101  CO2 22  GLUCOSE 116*  BUN 13  CREATININE 0.75  CALCIUM 9.3  MG 1.9   GFR: Estimated Creatinine Clearance: 116.8 mL/min (by C-G formula based on SCr of 0.75 mg/dL). Liver Function Tests: Recent Labs  Lab 02/20/21 2011  AST 56*  ALT 38  ALKPHOS 65  BILITOT  1.2  PROT 7.4  ALBUMIN 3.9   Recent Labs  Lab 02/20/21 2011  LIPASE 37   No results for input(s): AMMONIA in the last 168 hours. Coagulation Profile: No results for input(s): INR, PROTIME in the last 168 hours. Cardiac Enzymes: No results for input(s): CKTOTAL, CKMB, CKMBINDEX, TROPONINI in the last 168 hours. BNP (last 3 results) No results for input(s): PROBNP in the last 8760 hours. HbA1C: No results for input(s): HGBA1C in the last 72 hours. CBG: No results for input(s): GLUCAP in the last 168 hours. Lipid Profile: No results for input(s): CHOL, HDL, LDLCALC, TRIG, CHOLHDL, LDLDIRECT in the last 72 hours. Thyroid Function Tests: No results for input(s): TSH, T4TOTAL, FREET4, T3FREE, THYROIDAB in the last 72 hours. Anemia Panel: No results for input(s): VITAMINB12, FOLATE, FERRITIN, TIBC, IRON, RETICCTPCT in the last 72 hours. Urine analysis:    Component Value Date/Time   COLORURINE YELLOW 02/02/2021 0730   APPEARANCEUR HAZY (A) 02/02/2021 0730   LABSPEC 1.020 02/02/2021 0730   PHURINE 6.5 02/02/2021 0730   GLUCOSEU NEGATIVE 02/02/2021 0730   HGBUR MODERATE (A) 02/02/2021 0730   BILIRUBINUR NEGATIVE 02/02/2021 0730   KETONESUR NEGATIVE 02/02/2021 0730   PROTEINUR NEGATIVE 02/02/2021 0730   UROBILINOGEN 0.2 10/05/2011 1345   NITRITE NEGATIVE 02/02/2021 0730   LEUKOCYTESUR NEGATIVE 02/02/2021 0730   Sepsis Labs: @LABRCNTIP (procalcitonin:4,lacticidven:4) ) Recent Results (from the past 240 hour(s))  Resp Panel by RT-PCR (Flu A&B, Covid)     Status: None   Collection Time: 02/21/21 12:02 AM   Specimen: Nasopharyngeal(NP) swabs in vial transport medium  Result Value Ref Range Status   SARS Coronavirus 2 by RT PCR NEGATIVE NEGATIVE Final    Comment: (NOTE) SARS-CoV-2 target nucleic acids are NOT DETECTED.  The SARS-CoV-2 RNA is generally detectable in upper respiratory specimens during the acute phase of infection. The lowest concentration of SARS-CoV-2 viral  copies this assay can detect is 138 copies/mL. A negative result does not preclude SARS-Cov-2 infection and should not be used as the sole basis for treatment or other patient management decisions. A negative result may occur with  improper specimen collection/handling, submission of specimen other than nasopharyngeal swab, presence of viral mutation(s) within the areas targeted by this assay, and inadequate number of viral copies(<138 copies/mL). A negative result must be combined with clinical observations, patient history, and epidemiological information. The expected result is Negative.  Fact Sheet for Patients:  02/23/21  Fact Sheet for Healthcare Providers:  BloggerCourse.com  This test is no t yet approved or cleared by the [place] FDA and  has been authorized for detection and/or diagnosis of SARS-CoV-2 by FDA under an Emergency Use Authorization (EUA). This EUA will remain  in effect (meaning this test can be used) for the duration of the COVID-19 declaration under Section 564(b)(1) of the Act, 21 U.S.C.section  360bbb-3(b)(1), unless the authorization is terminated  or revoked sooner.       Influenza A by PCR NEGATIVE NEGATIVE Final   Influenza B by PCR NEGATIVE NEGATIVE Final    Comment: (NOTE) The Xpert Xpress SARS-CoV-2/FLU/RSV plus assay is intended as an aid in the diagnosis of influenza from Nasopharyngeal swab specimens and should not be used as a sole basis for treatment. Nasal washings and aspirates are unacceptable for Xpert Xpress SARS-CoV-2/FLU/RSV testing.  Fact Sheet for Patients: BloggerCourse.com  Fact Sheet for Healthcare Providers: SeriousBroker.it  This test is not yet approved or cleared by the [place] FDA and has been authorized for detection and/or diagnosis of SARS-CoV-2 by FDA under an Emergency Use Authorization (EUA). This  EUA will remain in effect (meaning this test can be used) for the duration of the COVID-19 declaration under Section 564(b)(1) of the Act, 21 U.S.C. section 360bbb-3(b)(1), unless the authorization is terminated or revoked.  Performed at [facility], [address] [place], [place] [address]      Radiological Exams on Admission: CT Abdomen Pelvis W Contrast  Result Date: 02/20/2021 CLINICAL DATA:  Epigastric pain after gallbladder removal beginning of April. EXAM: CT ABDOMEN AND PELVIS WITH CONTRAST TECHNIQUE: Multidetector CT imaging of the abdomen and pelvis was performed using the standard protocol following bolus administration of intravenous contrast. CONTRAST:  OMNIPAQUE IOHEXOL 300 MG/ML  SOLN COMPARISON:  02/02/2021 FINDINGS: Lower chest: The lung bases are clear. Hepatobiliary: Surgical absence of the gallbladder. Small amount of fluid and stranding in the gallbladder fossa is likely postoperative. No loculated collection. No bile duct dilatation. No focal liver lesions. Pancreas: Unremarkable. No pancreatic ductal dilatation or surrounding inflammatory changes. Spleen: Normal in size without focal abnormality. Adrenals/Urinary Tract: Adrenal glands are unremarkable. Kidneys are normal, without renal calculi, focal lesion, or hydronephrosis. Bladder is unremarkable. Stomach/Bowel: The stomach, small bowel, and colon are not abnormally distended. Scattered diverticula in the colon. No evidence of acute diverticulitis. Appendix is normal. Vascular/Lymphatic: No significant vascular findings are present. No enlarged abdominal or pelvic lymph nodes. Reproductive: Uterus and ovaries are not enlarged. Involuting cyst in the right ovary. Other: No free air or free fluid in the abdomen. Minimal periumbilical hernia containing fat. Musculoskeletal: No acute or significant osseous findings. IMPRESSION: 1. Surgical absence of the gallbladder. Small amount of fluid and stranding in the  gallbladder fossa is likely postoperative. No loculated collection. 2. No evidence of bowel obstruction or inflammation. 3. Involuting cyst in the right ovary. 4. Minimal periumbilical hernia containing fat. Electronically Signed   By: Burman Nieves M.D.   On: 02/20/2021 21:41   DG Chest Port 1 View  Result Date: 02/20/2021 CLINICAL DATA:  Epigastric pain.  Cholecystectomy 2 weeks ago. EXAM: PORTABLE CHEST 1 VIEW COMPARISON:  None. FINDINGS: The cardiomediastinal contours are normal. The lungs are clear. Pulmonary vasculature is normal. No consolidation, pleural effusion, or pneumothorax. No acute osseous abnormalities are seen. IMPRESSION: Negative portable view of the chest. Electronically Signed   By: Narda Rutherford M.D.   On: 02/20/2021 20:34    EKG: Independently reviewed.  Sinus tachycardia.  Assessment/Plan Principal Problem:   Intractable epigastric abdominal pain Active Problems:   Abdominal pain    1. Intractable epigastric pain cause not clear.  Patient just had cholecystectomy 2 weeks ago.  CT abdomen pelvis does not show anything acute at this time.  We will keep patient n.p.o. and on pain medication IV fluids and consult general surgery.  Follow  LFTs.  Patient is presently afebrile. 2. Mild hyponatremia likely from dehydration presently on fluids follow metabolic panel.   DVT prophylaxis: SCDs.  Avoiding anticoagulation in anticipation of possible procedure if needed. Code Status: Full code. Family Communication: Discussed with patient. Disposition Plan: Home. Consults called: We will consult general surgery. Admission status: Observation.   Eduard Clos MD [facility] Pager [phone].  If 7PM-7AM, please contact night-coverage [email or web address] Password [email or web address]  02/21/2021, 2:19 AM

## 2021-02-21 NOTE — ED Notes (Signed)
Pt upset at this time, vital signs filed in flowsheet at this time reflect change in emotions.

## 2021-02-21 NOTE — Consult Note (Signed)
RENNA Bass 10-Apr-1986  [id number].    Requesting MD: Dr. Lonny Prude Chief Complaint/Reason for Consult: Epigastric abdominal pain, elevated LFT's, s/p Lap Chole 4/11  HPI: Jill Bass is a 35 y.o. female who is status post laparoscopic cholecystectomy by Dr. Georgette Dover on 4/11 who presented to the ED on 4/28 with epigastric abdominal pain, nausea and vomiting.  Patient reports she was doing well after discharge.  She notes prior to onset of symptoms yesterday she was without any abdominal pain, nausea or vomiting.  She is back to eating normal foods including pizza and macaroni & cheese.  She reports she is having normal bowel movements at home.  At work yesterday she had a donut around 9 AM and approximate 1 hour later (10 AM) she began having epigastric abdominal pain with radiation to her bilateral flanks with associated nausea.  Pain was initially a 4/10 but gradually increased to a 9/10 around 5:30 PM with associated vomiting that prompted presentation to the ED.  She tried Gas-X and hot tea for this without relief.  She reports this pain felt exactly like how she presented when she had acute cholecystitis.  She notes over the last few hours her pain has greatly improved and now waxes and wanes between a 2-3/10 in her epigastrium.  Last dose of pain medication was at 0118.  She does have persistent nausea. No associated fever, chills, CP, SOB, urinary symptoms or diarrhea.   Since admission patient has been afebrile.  While in the ED she was tachycardic up to 150 but this is resolved this morning with last heart rate 72 on last vitals.  Patients initial BP 86/44.  This is also improved with last BP 139/79 on last vitals.  Underwent a CT of the A/P that showed a small amount of fluid and stranding in the gallbladder fossa that was likely postoperative with no loculated fluid collection.  LFTs were elevated this morning with AST 376, ALT 253, alk phos 103, T bili 1.4. We were asked to see.    ROS: Review of Systems  Constitutional: Negative for chills and fever.  Respiratory: Negative for shortness of breath and wheezing.   Cardiovascular: Negative for chest pain and leg swelling.  Gastrointestinal: Positive for abdominal pain, nausea and vomiting. Negative for constipation and diarrhea.  Genitourinary: Negative for dysuria, frequency, hematuria and urgency.  Musculoskeletal: Negative for back pain.  Skin: Negative for rash.  Psychiatric/Behavioral: Negative for substance abuse.  All other systems reviewed and are negative.   History reviewed. No pertinent family history.  Past Medical History:  Diagnosis Date  . Kidney stones   . Migraine   . PCOS (polycystic ovarian syndrome)     Past Surgical History:  Procedure Laterality Date  . CHOLECYSTECTOMY N/A 02/03/2021   Procedure: LAPAROSCOPIC CHOLECYSTECTOMY WITH INTRAOPERATIVE CHOLANGIOGRAM;  Surgeon: Donnie Mesa, MD;  Location: [place];  Service: General;  Laterality: N/A;  . TYMPANOSTOMY TUBE PLACEMENT      Social History:  reports that she has never smoked. She has never used smokeless tobacco. She reports current alcohol use. She reports that she does not use drugs. Works at [employer]  Allergies:  Allergies  Allergen Reactions  . Amoxicillin Hives  . Penicillins Hives  . Latex Rash    Medications Prior to Admission  Medication Sig Dispense Refill  . ibuprofen (ADVIL) 200 MG tablet Take 200 mg by mouth every 6 (six) hours as needed for mild pain.    . simethicone (MYLICON)  125 MG chewable tablet Chew 125 mg by mouth every 6 (six) hours as needed. gas    . oxyCODONE (OXY IR/ROXICODONE) 5 MG immediate release tablet Take 1 tablet (5 mg total) by mouth every 6 (six) hours as needed for breakthrough pain. (Patient not taking: Reported on 02/21/2021) 15 tablet 0     Physical Exam: Blood pressure 139/79, pulse 72, temperature 97.6 F (36.4 C), temperature source Oral, resp. rate 19, height 5' 2"  (1.575 m),  weight 114.7 kg, last menstrual period 02/01/2021, SpO2 100 %. General: pleasant, WD/WN white female who is laying in bed in NAD HEENT: head is normocephalic, atraumatic.  Sclera are noninjected.  PERRL.  Ears and nose without any masses or lesions.  Mouth is pink and moist. Dentition fair Heart: regular, rate, and rhythm.  Normal s1,s2. No obvious murmurs, gallops, or rubs noted.  Palpable pedal pulses bilaterally  Lungs: CTAB, no wheezes, rhonchi, or rales noted.  Respiratory effort nonlabored Abd: Soft, ND, epigastric and RUQ tenderness without peritonitis. Otherwise NT. Unable to palpate periumbilical hernia noted on CT scan. No other hernia's palpated. No organomegaly or masses. Laparoscopic incisions c/d/i and without signs of infection. No CVA tenderness.  MS: no BUE/BLE edema, calves soft and nontender Skin: warm and dry with no masses, lesions, or rashes Psych: A&Ox4 with an appropriate affect Neuro: cranial nerves grossly intact, moves all extremities, normal speech, thought process intact, gait not assessed.  Results for orders placed or performed during the hospital encounter of 02/20/21 (from the past 48 hour(s))  CBC with Differential/Platelet     Status: Abnormal   Collection Time: 02/20/21  8:11 PM  Result Value Ref Range   WBC 10.8 (H) 4.0 - 10.5 K/uL   RBC 4.62 3.87 - 5.11 MIL/uL   Hemoglobin 13.1 12.0 - 15.0 g/dL   HCT 38.5 36.0 - 46.0 %   MCV 83.3 80.0 - 100.0 fL   MCH 28.4 26.0 - 34.0 pg   MCHC 34.0 30.0 - 36.0 g/dL   RDW 13.3 11.5 - 15.5 %   Platelets 270 150 - 400 K/uL   nRBC 0.0 0.0 - 0.2 %   Neutrophils Relative % 57 %   Neutro Abs 6.2 1.7 - 7.7 K/uL   Lymphocytes Relative 34 %   Lymphs Abs 3.6 0.7 - 4.0 K/uL   Monocytes Relative 6 %   Monocytes Absolute 0.7 0.1 - 1.0 K/uL   Eosinophils Relative 2 %   Eosinophils Absolute 0.2 0.0 - 0.5 K/uL   Basophils Relative 1 %   Basophils Absolute 0.1 0.0 - 0.1 K/uL   Immature Granulocytes 0 %   Abs Immature  Granulocytes 0.03 0.00 - 0.07 K/uL    Comment: Performed at [facility], [address] [place], [place] [address]  Lactic acid, plasma     Status: None   Collection Time: 02/20/21  8:11 PM  Result Value Ref Range   Lactic Acid, Venous 1.5 0.5 - 1.9 mmol/L    Comment: Performed at [facility], [address] [place], [place] [address]  Culture, blood (Routine X 2) w Reflex to ID Panel     Status: None (Preliminary result)   Collection Time: 02/20/21  8:11 PM   Specimen: BLOOD  Result Value Ref Range   Specimen Description      BLOOD RIGHT ANTECUBITAL Performed at [facility], [address], [place], [place] [address]    Special Requests  BOTTLES DRAWN AEROBIC AND ANAEROBIC Blood Culture adequate volume Performed at [facility], [address] [place], [place] [address]    Culture      NO GROWTH < 12 HOURS Performed at [facility] [address] [place], [place] [address]    Report Status PENDING   Comprehensive metabolic panel     Status: Abnormal   Collection Time: 02/20/21  8:11 PM  Result Value Ref Range   Sodium 134 (L) 135 - 145 mmol/L   Potassium 3.5 3.5 - 5.1 mmol/L   Chloride 101 98 - 111 mmol/L   CO2 22 22 - 32 mmol/L   Glucose, Bld 116 (H) 70 - 99 mg/dL    Comment: Glucose reference range applies only to samples taken after fasting for at least 8 hours.   BUN 13 6 - 20 mg/dL   Creatinine, Ser 0.75 0.44 - 1.00 mg/dL   Calcium 9.3 8.9 - 10.3 mg/dL   Total Protein 7.4 6.5 - 8.1 g/dL   Albumin 3.9 3.5 - 5.0 g/dL   AST 56 (H) 15 - 41 U/L   ALT 38 0 - 44 U/L   Alkaline Phosphatase 65 38 - 126 U/L   Total Bilirubin 1.2 0.3 - 1.2 mg/dL   GFR, Estimated >60 >60 mL/min    Comment: (NOTE) Calculated using the CKD-EPI Creatinine Equation (2021)    Anion gap 11 5 - 15    Comment: Performed at [facility], [address] [place], [place] [address]  Lipase, blood     Status: None   Collection  Time: 02/20/21  8:11 PM  Result Value Ref Range   Lipase 37 11 - 51 U/L    Comment: Performed at [facility], [address] [place], [place] [address]  hCG, quantitative, pregnancy     Status: None   Collection Time: 02/20/21  8:11 PM  Result Value Ref Range   hCG, Beta Chain, Quant, S <1 <5 mIU/mL    Comment:          GEST. AGE      CONC.  (mIU/mL)   <=1 WEEK        5 - 50     2 WEEKS       50 - 500     3 WEEKS       100 - 10,000     4 WEEKS     1,000 - 30,000     5 WEEKS     3,500 - 115,000   6-8 WEEKS     12,000 - 270,000    12 WEEKS     15,000 - 220,000        FEMALE AND NON-PREGNANT FEMALE:     LESS THAN 5 mIU/mL Performed at [facility], [address], [place], [place] [address]   Magnesium     Status: None   Collection Time: 02/20/21  8:11 PM  Result Value Ref Range   Magnesium 1.9 1.7 - 2.4 mg/dL    Comment: Performed at [facility], [address] [place], [place] [address]  Culture, blood (Routine X 2) w Reflex to ID Panel     Status: None (Preliminary result)   Collection Time: 02/20/21  8:35 PM   Specimen: BLOOD  Result Value Ref Range   Specimen Description      BLOOD LEFT ANTECUBITAL Performed at [facility], [address] [place], [place] [address]    Special Requests      BOTTLES DRAWN AEROBIC AND ANAEROBIC Blood Culture adequate volume Performed at [facility], [address] [place], [place] [address]    Culture      NO GROWTH < 12 HOURS Performed at [facility] [address], [place], [place] [address]    Report Status PENDING   Resp Panel by RT-PCR (Flu A&B, Covid)     Status: None   Collection Time: 02/21/21 12:02 AM   Specimen: Nasopharyngeal(NP) swabs in vial transport medium  Result Value Ref Range   SARS Coronavirus 2 by RT PCR NEGATIVE NEGATIVE    Comment: (NOTE) SARS-CoV-2 target nucleic acids are NOT DETECTED.  The SARS-CoV-2 RNA is generally detectable in upper  respiratory specimens during the acute phase of infection. The lowest concentration of SARS-CoV-2 viral copies this assay can detect is 138 copies/mL. A negative result does not preclude SARS-Cov-2 infection and should not be used as the sole basis for treatment or other patient management decisions. A negative result may occur with  improper specimen collection/handling, submission of specimen other than nasopharyngeal swab, presence of viral mutation(s) within the areas targeted by this assay, and inadequate number of viral copies(<138 copies/mL). A negative result must be combined with clinical observations, patient history, and epidemiological information. The expected result is Negative.  Fact Sheet for Patients:  EntrepreneurPulse.com.au  Fact Sheet for Healthcare Providers:  IncredibleEmployment.be  This test is no t yet approved or cleared by the [place] FDA and  has been authorized for detection and/or diagnosis of SARS-CoV-2 by FDA under an Emergency Use Authorization (EUA). This EUA will remain  in effect (meaning this test can be used) for the duration of the COVID-19 declaration under Section 564(b)(1) of the Act, 21 U.S.C.section 360bbb-3(b)(1), unless the authorization is terminated  or revoked sooner.       Influenza A by PCR NEGATIVE NEGATIVE   Influenza B by PCR NEGATIVE NEGATIVE    Comment: (NOTE) The Xpert Xpress SARS-CoV-2/FLU/RSV plus assay is intended as an aid in the diagnosis of influenza from Nasopharyngeal swab specimens and should not be used as a sole basis for treatment. Nasal washings and aspirates are unacceptable for Xpert Xpress SARS-CoV-2/FLU/RSV testing.  Fact Sheet for Patients: EntrepreneurPulse.com.au  Fact Sheet for Healthcare Providers: IncredibleEmployment.be  This test is not yet approved or cleared by the [place] FDA and has been authorized for  detection and/or diagnosis of SARS-CoV-2 by FDA under an Emergency Use Authorization (EUA). This EUA will remain in effect (meaning this test can be used) for the duration of the COVID-19 declaration under Section 564(b)(1) of the Act, 21 U.S.C. section 360bbb-3(b)(1), unless the authorization is terminated or revoked.  Performed at [facility], [address] [place], [place] [address]   Basic metabolic panel     Status: Abnormal   Collection Time: 02/21/21  6:10 AM  Result Value Ref Range   Sodium 136 135 - 145 mmol/L   Potassium 4.3 3.5 - 5.1 mmol/L   Chloride 105 98 - 111 mmol/L   CO2 21 (L) 22 - 32 mmol/L   Glucose, Bld 140 (H) 70 - 99 mg/dL    Comment: Glucose reference range applies only to samples taken after fasting for at least 8 hours.   BUN 10 6 - 20 mg/dL   Creatinine, Ser 0.70 0.44 - 1.00 mg/dL   Calcium 8.6 (L) 8.9 -  10.3 mg/dL   GFR, Estimated >60 >60 mL/min    Comment: (NOTE) Calculated using the CKD-EPI Creatinine Equation (2021)    Anion gap 10 5 - 15    Comment: Performed at [facility], [place] [address], [place], [place] [address]  Hepatic function panel     Status: Abnormal   Collection Time: 02/21/21  6:10 AM  Result Value Ref Range   Total Protein 7.1 6.5 - 8.1 g/dL   Albumin 3.8 3.5 - 5.0 g/dL   AST 376 (H) 15 - 41 U/L   ALT 253 (H) 0 - 44 U/L   Alkaline Phosphatase 103 38 - 126 U/L   Total Bilirubin 1.4 (H) 0.3 - 1.2 mg/dL   Bilirubin, Direct 0.7 (H) 0.0 - 0.2 mg/dL   Indirect Bilirubin 0.7 0.3 - 0.9 mg/dL    Comment: Performed at [facility], [place] [address], [place], [place] [address]  CBC WITH DIFFERENTIAL     Status: None   Collection Time: 02/21/21  6:10 AM  Result Value Ref Range   WBC 8.7 4.0 - 10.5 K/uL   RBC 4.34 3.87 - 5.11 MIL/uL   Hemoglobin 12.1 12.0 - 15.0 g/dL   HCT 37.8 36.0 - 46.0 %   MCV 87.1 80.0 - 100.0 fL   MCH 27.9 26.0 - 34.0 pg   MCHC 32.0 30.0 - 36.0 g/dL   RDW 13.4  11.5 - 15.5 %   Platelets 243 150 - 400 K/uL   nRBC 0.0 0.0 - 0.2 %   Neutrophils Relative % 84 %   Neutro Abs 7.3 1.7 - 7.7 K/uL   Lymphocytes Relative 10 %   Lymphs Abs 0.9 0.7 - 4.0 K/uL   Monocytes Relative 5 %   Monocytes Absolute 0.4 0.1 - 1.0 K/uL   Eosinophils Relative 0 %   Eosinophils Absolute 0.0 0.0 - 0.5 K/uL   Basophils Relative 0 %   Basophils Absolute 0.0 0.0 - 0.1 K/uL   Immature Granulocytes 1 %   Abs Immature Granulocytes 0.04 0.00 - 0.07 K/uL    Comment: Performed at [facility], [place] [address], [place], [place] [address]   CT Abdomen Pelvis W Contrast  Result Date: 02/20/2021 CLINICAL DATA:  Epigastric pain after gallbladder removal beginning of April. EXAM: CT ABDOMEN AND PELVIS WITH CONTRAST TECHNIQUE: Multidetector CT imaging of the abdomen and pelvis was performed using the standard protocol following bolus administration of intravenous contrast. CONTRAST:  161m OMNIPAQUE IOHEXOL 300 MG/ML  SOLN COMPARISON:  02/02/2021 FINDINGS: Lower chest: The lung bases are clear. Hepatobiliary: Surgical absence of the gallbladder. Small amount of fluid and stranding in the gallbladder fossa is likely postoperative. No loculated collection. No bile duct dilatation. No focal liver lesions. Pancreas: Unremarkable. No pancreatic ductal dilatation or surrounding inflammatory changes. Spleen: Normal in size without focal abnormality. Adrenals/Urinary Tract: Adrenal glands are unremarkable. Kidneys are normal, without renal calculi, focal lesion, or hydronephrosis. Bladder is unremarkable. Stomach/Bowel: The stomach, small bowel, and colon are not abnormally distended. Scattered diverticula in the colon. No evidence of acute diverticulitis. Appendix is normal. Vascular/Lymphatic: No significant vascular findings are present. No enlarged abdominal or pelvic lymph nodes. Reproductive: Uterus and ovaries are not enlarged. Involuting cyst in the right ovary. Other: No free  air or free fluid in the abdomen. Minimal periumbilical hernia containing fat. Musculoskeletal: No acute or significant osseous findings. IMPRESSION: 1. Surgical absence of the gallbladder. Small amount of fluid and stranding in the gallbladder fossa  is likely postoperative. No loculated collection. 2. No evidence of bowel obstruction or inflammation. 3. Involuting cyst in the right ovary. 4. Minimal periumbilical hernia containing fat. Electronically Signed   By: Lucienne Capers M.D.   On: 02/20/2021 21:41   DG Chest Port 1 View  Result Date: 02/20/2021 CLINICAL DATA:  Epigastric pain.  Cholecystectomy 2 weeks ago. EXAM: PORTABLE CHEST 1 VIEW COMPARISON:  None. FINDINGS: The cardiomediastinal contours are normal. The lungs are clear. Pulmonary vasculature is normal. No consolidation, pleural effusion, or pneumothorax. No acute osseous abnormalities are seen. IMPRESSION: Negative portable view of the chest. Electronically Signed   By: Keith Rake M.D.   On: 02/20/2021 20:34   Anti-infectives (From admission, onward)   None      Assessment/Plan This is a 35 y.o. female who presented with epigastric abdominal pain, n/v, elevated LFT's in the setting of recent Laparoscopic Cholecystectomy by Dr. Georgette Dover on 4/11.  Patient initially with tachycardia and hypotension on presentation that is since resolved. CT of the A/P showed a small amount of fluid and stranding in the gallbladder fossa that was likely postoperative with no loculated fluid collection.  LFTs were elevated this morning with AST 56>376, ALT 38>253, alk phos 65>103, T bili 1.2> 1.4. Lipase wnl yesterday. WBC 10.8 > 8.7.  Patient had an attempted a cholangiogram during procedure on 4/11 however only partially visualized the common bile duct due to leakage of contrast around the catheter through the cystic duct per op note.  Will obtain an MRCP to evaluate for retained CBD stone.  If this is negative may need a HIDA scan to rule out a leak if  patients symptoms do not resolve. No indication for abx presently. We will follow along with you. Please keep NPO.   Jillyn Ledger, [facility] Surgery 02/21/2021, 10:14 AM Please see Amion for pager number during day hours 7:00am-4:30pm

## 2021-02-21 NOTE — Progress Notes (Signed)
As wished, contacted Fatema Rabe (Mother) to let her know she has arrived.

## 2021-02-21 NOTE — Progress Notes (Signed)
   Patient seen and examined at bedside, patient admitted after midnight, please see earlier detailed admission note by Eduard Clos, MD. Briefly, patient presented secondary to abdominal pain in setting of recent cholecystectomy.   Subjective: Abdominal pain is waxing/waning. Some nausea with no vomiting.  BP 134/77 (BP Location: Left Arm)   Pulse 73   Temp 97.6 F (36.4 C) (Oral)   Resp 17   Ht 5\' 2"  (1.575 m)   Wt 114.7 kg   LMP 02/01/2021   SpO2 95%   BMI 46.25 kg/m   General exam: Appears calm and comfortable Respiratory system: Clear to auscultation. Respiratory effort normal. Cardiovascular system: S1 & S2 heard, RRR. No murmurs, rubs, gallops or clicks. Gastrointestinal system: Abdomen is nondistended, soft and mildly tender in epigastric area. No organomegaly or masses felt. Normal bowel sounds heard. Central nervous system: Alert and oriented. No focal neurological deficits. Musculoskeletal: No edema. No calf tenderness Skin: No cyanosis. No rashes Psychiatry: Judgement and insight appear normal. Mood & affect appropriate.   Brief assessment/Plan:  Abdominal pain CT abdomen/pelvis was negative for loculated fluid. AST/ALT/Bilirubin up today. ?biliary leak vs stone. -Pain management -General surgery consult/recommendations -Daily CMP  Family communication: None at bedside DVT prophylaxis: SCDs Disposition: Discharge pending consultant recommendations and continued management  04/03/2021, MD [facility] 02/21/2021, 7:30 AM

## 2021-02-22 ENCOUNTER — Inpatient Hospital Stay (HOSPITAL_COMMUNITY): Payer: Managed Care, Other (non HMO)

## 2021-02-22 DIAGNOSIS — Z20822 Contact with and (suspected) exposure to covid-19: Secondary | ICD-10-CM | POA: Diagnosis present

## 2021-02-22 DIAGNOSIS — Z79899 Other long term (current) drug therapy: Secondary | ICD-10-CM | POA: Diagnosis not present

## 2021-02-22 DIAGNOSIS — K219 Gastro-esophageal reflux disease without esophagitis: Secondary | ICD-10-CM | POA: Diagnosis present

## 2021-02-22 DIAGNOSIS — R7989 Other specified abnormal findings of blood chemistry: Secondary | ICD-10-CM | POA: Diagnosis present

## 2021-02-22 DIAGNOSIS — K2210 Ulcer of esophagus without bleeding: Secondary | ICD-10-CM | POA: Diagnosis present

## 2021-02-22 DIAGNOSIS — K2970 Gastritis, unspecified, without bleeding: Secondary | ICD-10-CM | POA: Diagnosis present

## 2021-02-22 DIAGNOSIS — Z6841 Body Mass Index (BMI) 40.0 and over, adult: Secondary | ICD-10-CM | POA: Diagnosis not present

## 2021-02-22 DIAGNOSIS — N83201 Unspecified ovarian cyst, right side: Secondary | ICD-10-CM | POA: Diagnosis present

## 2021-02-22 DIAGNOSIS — R1013 Epigastric pain: Secondary | ICD-10-CM | POA: Diagnosis present

## 2021-02-22 DIAGNOSIS — E871 Hypo-osmolality and hyponatremia: Secondary | ICD-10-CM | POA: Diagnosis present

## 2021-02-22 DIAGNOSIS — Z9049 Acquired absence of other specified parts of digestive tract: Secondary | ICD-10-CM | POA: Diagnosis not present

## 2021-02-22 DIAGNOSIS — Z880 Allergy status to penicillin: Secondary | ICD-10-CM | POA: Diagnosis not present

## 2021-02-22 DIAGNOSIS — Z91040 Latex allergy status: Secondary | ICD-10-CM | POA: Diagnosis not present

## 2021-02-22 LAB — CBC
HCT: 37.3 % (ref 36.0–46.0)
Hemoglobin: 12 g/dL (ref 12.0–15.0)
MCH: 28 pg (ref 26.0–34.0)
MCHC: 32.2 g/dL (ref 30.0–36.0)
MCV: 87.1 fL (ref 80.0–100.0)
Platelets: 230 10*3/uL (ref 150–400)
RBC: 4.28 MIL/uL (ref 3.87–5.11)
RDW: 13.9 % (ref 11.5–15.5)
WBC: 6.6 10*3/uL (ref 4.0–10.5)
nRBC: 0 % (ref 0.0–0.2)

## 2021-02-22 LAB — COMPREHENSIVE METABOLIC PANEL
ALT: 371 U/L — ABNORMAL HIGH (ref 0–44)
AST: 339 U/L — ABNORMAL HIGH (ref 15–41)
Albumin: 3.6 g/dL (ref 3.5–5.0)
Alkaline Phosphatase: 129 U/L — ABNORMAL HIGH (ref 38–126)
Anion gap: 4 — ABNORMAL LOW (ref 5–15)
BUN: 8 mg/dL (ref 6–20)
CO2: 27 mmol/L (ref 22–32)
Calcium: 8.9 mg/dL (ref 8.9–10.3)
Chloride: 112 mmol/L — ABNORMAL HIGH (ref 98–111)
Creatinine, Ser: 0.75 mg/dL (ref 0.44–1.00)
GFR, Estimated: >60 mL/min (ref >60)
Glucose, Bld: 119 mg/dL — ABNORMAL HIGH (ref 70–99)
Potassium: 3.9 mmol/L (ref 3.5–5.1)
Sodium: 143 mmol/L (ref 135–145)
Total Bilirubin: 2.3 mg/dL — ABNORMAL HIGH (ref 0.3–1.2)
Total Protein: 6.5 g/dL (ref 6.5–8.1)

## 2021-02-22 IMAGING — NM NM HEPATOBILIARY SCAN
2 series · 12 of 12 positions shown · non-contrast
Comparison: [DATE], [DATE]

CLINICAL DATA: Upper abdominal pain, nausea and vomiting for 2
days, cholecystectomy [DATE]

EXAM:
NUCLEAR MEDICINE HEPATOBILIARY IMAGING
TECHNIQUE: Sequential images of the abdomen were obtained [DATE] minutes
following intravenous administration of radiopharmaceutical.
RADIOPHARMACEUTICALS:  5.5 mCi [DV]  Choletec IV

[Series 1: leak 1 hr · 3.28mm/px · 6 of 60 frames shown]
[frame 6/60]
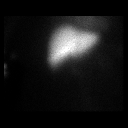
[frame 16/60]
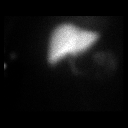
[frame 26/60]
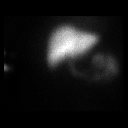
[frame 36/60]
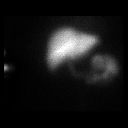
[frame 46/60]
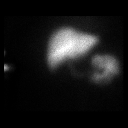
[frame 56/60]
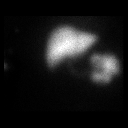

[Series 1: leak 2hr · 3.28mm/px · 6 of 60 frames shown]
[frame 6/60]
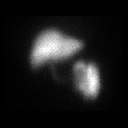
[frame 16/60]
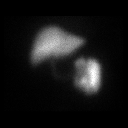
[frame 26/60]
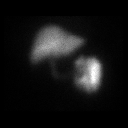
[frame 36/60]
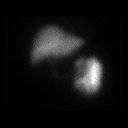
[frame 46/60]
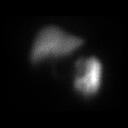
[frame 56/60]
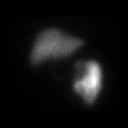

[12 of 12 positions shown; findings below may reference images not displayed]

FINDINGS: There is prompt uptake of radiotracer by the hepatic parenchyma.
Anterior imaging was performed over 2 hours. Radiotracer is seen
outlining the common bile duct and small bowel. There is no abnormal
radiotracer accumulation to suggest bile leak.
IMPRESSION: 1. Normal hepatobiliary scan in a patient status post
cholecystectomy. No evidence of bile leak.

## 2021-02-22 MED ORDER — TECHNETIUM TC 99M MEBROFENIN IV KIT
5.5000 | PACK | Freq: Once | INTRAVENOUS | Status: AC
  Administered 2021-02-22: 5.5 via INTRAVENOUS

## 2021-02-22 MED ORDER — BISACODYL 10 MG RE SUPP: 10.0000 mg | Freq: Every day | RECTAL | Status: DC | PRN

## 2021-02-22 MED ORDER — SIMETHICONE 80 MG PO CHEW
80.0000 mg | CHEWABLE_TABLET | Freq: Four times a day (QID) | ORAL | Status: DC | PRN
  Administered 2021-02-22: 80 mg via ORAL
  Filled 2021-02-22: qty 1

## 2021-02-22 MED ORDER — LACTATED RINGERS IV SOLN: INTRAVENOUS | Status: DC

## 2021-02-22 NOTE — Progress Notes (Signed)
PROGRESS NOTE    Jill Bass  MVE:[id number] DOB: 1985/12/18 DOA: 02/20/2021 PCP: Laurann Montana, MD   Brief Narrative: Jill Bass is a 35 y.o. female with a history of biliary colic status post atraumatic cholecystectomy. Patient presented with abdominal pain with concern for possible bile leak.   Assessment & Plan:   Principal Problem:   Intractable epigastric abdominal pain Active Problems:   Abdominal pain   Abdominal pain CT abdomen/pelvis was negative for loculated fluid. AST/ALT/Bilirubin elevated and stable. General surgery consulted. MRCP without evidence of stone or ductal dilation. -Pain management -General surgery recommendations: HIDA scan, hepatitis panel -Daily CMP   DVT prophylaxis: SCDs Code Status:   Code Status: Full Code Family Communication: Mother at bedside Disposition Plan: Discharge home pending continued workup for abdominal pain, general surgery recommendations   Consultants:   General surgery  Procedures:   None  Antimicrobials:  None    Subjective: Patient reports waxing/waning epigastric abdominal pain with radiation down to lower mid-abdomen.  Objective: Vitals:   02/21/21 1009 02/21/21 1406 02/21/21 2149 02/22/21 0540  BP: 139/79 (!) 145/97 128/73 119/72  Pulse: 72 83 79 79  Resp: 19 17 12 12   Temp: 97.6 F (36.4 C) 98.2 F (36.8 C) 98.3 F (36.8 C) 97.9 F (36.6 C)  TempSrc: Oral Oral Oral Oral  SpO2: 100% 100% 95% 96%  Weight:      Height:        Intake/Output Summary (Last 24 hours) at 02/22/2021 1217 Last data filed at 02/22/2021 0540 Gross per 24 hour  Intake 410 ml  Output --  Net 410 ml   Filed Weights   02/20/21 1948 02/21/21 0209  Weight: 115.2 kg 114.7 kg    Examination:  General exam: Appears calm and comfortable Respiratory system: Clear to auscultation. Respiratory effort normal. Cardiovascular system: S1 & S2 heard, RRR. No murmurs, rubs, gallops or clicks. Gastrointestinal  system: Abdomen is nondistended, soft and mildly tender in epigastric area.  Normal bowel sounds heard. Central nervous system: Alert and oriented. No focal neurological deficits. Musculoskeletal: No edema. No calf tenderness Skin: No cyanosis. No rashes Psychiatry: Judgement and insight appear normal. Mood & affect appropriate.     Data Reviewed: I have personally reviewed following labs and imaging studies  CBC Lab Results  Component Value Date   WBC 6.6 02/22/2021   RBC 4.28 02/22/2021   HGB 12.0 02/22/2021   HCT 37.3 02/22/2021   MCV 87.1 02/22/2021   MCH 28.0 02/22/2021   PLT 230 02/22/2021   MCHC 32.2 02/22/2021   RDW 13.9 02/22/2021   LYMPHSABS 0.9 02/21/2021   MONOABS 0.4 02/21/2021   EOSABS 0.0 02/21/2021   BASOSABS 0.0 02/21/2021     Last metabolic panel Lab Results  Component Value Date   NA 143 02/22/2021   K 3.9 02/22/2021   CL 112 (H) 02/22/2021   CO2 27 02/22/2021   BUN 8 02/22/2021   CREATININE 0.75 02/22/2021   GLUCOSE 119 (H) 02/22/2021   GFRNONAA >60 02/22/2021   GFRAA >90 10/05/2011   CALCIUM 8.9 02/22/2021   PROT 6.5 02/22/2021   ALBUMIN 3.6 02/22/2021   BILITOT 2.3 (H) 02/22/2021   ALKPHOS 129 (H) 02/22/2021   AST 339 (H) 02/22/2021   ALT 371 (H) 02/22/2021   ANIONGAP 4 (L) 02/22/2021    CBG (last 3)  No results for input(s): GLUCAP in the last 72 hours.   GFR: Estimated Creatinine Clearance: 116.5 mL/min (by C-G formula based on SCr  of 0.75 mg/dL).  Coagulation Profile: No results for input(s): INR, PROTIME in the last 168 hours.  Recent Results (from the past 240 hour(s))  Culture, blood (Routine X 2) w Reflex to ID Panel     Status: None (Preliminary result)   Collection Time: 02/20/21  8:11 PM   Specimen: BLOOD  Result Value Ref Range Status   Specimen Description   Final    BLOOD RIGHT ANTECUBITAL Performed at [facility], [address] [place], [place] [address]    Special Requests   Final    BOTTLES DRAWN  AEROBIC AND ANAEROBIC Blood Culture adequate volume Performed at [facility], [address] [place], [place] [address]    Culture   Final    NO GROWTH 2 DAYS Performed at [facility], [address] [place], [place] [address]    Report Status PENDING  Incomplete  Culture, blood (Routine X 2) w Reflex to ID Panel     Status: None (Preliminary result)   Collection Time: 02/20/21  8:35 PM   Specimen: BLOOD  Result Value Ref Range Status   Specimen Description   Final    BLOOD LEFT ANTECUBITAL Performed at [facility], [address] [place], [place] [address]    Special Requests   Final    BOTTLES DRAWN AEROBIC AND ANAEROBIC Blood Culture adequate volume Performed at [facility], [address] [place], [place] [address]    Culture   Final    NO GROWTH 2 DAYS Performed at [facility], [address] [place], [place] [address]    Report Status PENDING  Incomplete  Resp Panel by RT-PCR (Flu A&B, Covid)     Status: None   Collection Time: 02/21/21 12:02 AM   Specimen: Nasopharyngeal(NP) swabs in vial transport medium  Result Value Ref Range Status   SARS Coronavirus 2 by RT PCR NEGATIVE NEGATIVE Final    Comment: (NOTE) SARS-CoV-2 target nucleic acids are NOT DETECTED.  The SARS-CoV-2 RNA is generally detectable in upper respiratory specimens during the acute phase of infection. The lowest concentration of SARS-CoV-2 viral copies this assay can detect is 138 copies/mL. A negative result does not preclude SARS-Cov-2 infection and should not be used as the sole basis for treatment or other patient management decisions. A negative result may occur with  improper specimen collection/handling, submission of specimen other than nasopharyngeal swab, presence of viral mutation(s) within the areas targeted by this assay, and inadequate number of viral copies(<138 copies/mL). A negative result must be combined with clinical  observations, patient history, and epidemiological information. The expected result is Negative.  Fact Sheet for Patients:  BloggerCourse.com  Fact Sheet for Healthcare Providers:  SeriousBroker.it  This test is no t yet approved or cleared by the [place] FDA and  has been authorized for detection and/or diagnosis of SARS-CoV-2 by FDA under an Emergency Use Authorization (EUA). This EUA will remain  in effect (meaning this test can be used) for the duration of the COVID-19 declaration under Section 564(b)(1) of the Act, 21 U.S.C.section 360bbb-3(b)(1), unless the authorization is terminated  or revoked sooner.       Influenza A by PCR NEGATIVE NEGATIVE Final   Influenza B by PCR NEGATIVE NEGATIVE Final    Comment: (NOTE) The Xpert Xpress SARS-CoV-2/FLU/RSV plus assay is intended as an aid in the diagnosis of influenza from Nasopharyngeal swab specimens and should not be used  as a sole basis for treatment. Nasal washings and aspirates are unacceptable for Xpert Xpress SARS-CoV-2/FLU/RSV testing.  Fact Sheet for Patients: BloggerCourse.comhttps://www.fda.gov/media/152166/download  Fact Sheet for Healthcare Providers: SeriousBroker.ithttps://www.fda.gov/media/152162/download  This test is not yet approved or cleared by the [place] FDA and has been authorized for detection and/or diagnosis of SARS-CoV-2 by FDA under an Emergency Use Authorization (EUA). This EUA will remain in effect (meaning this test can be used) for the duration of the COVID-19 declaration under Section 564(b)(1) of the Act, 21 U.S.C. section 360bbb-3(b)(1), unless the authorization is terminated or revoked.  Performed at [facility], [address] [place], [place] [id number]         Radiology Studies: CT Abdomen Pelvis W Contrast  Result Date: 02/20/2021 CLINICAL DATA:  Epigastric pain after gallbladder removal beginning of April. EXAM: CT ABDOMEN AND  PELVIS WITH CONTRAST TECHNIQUE: Multidetector CT imaging of the abdomen and pelvis was performed using the standard protocol following bolus administration of intravenous contrast. CONTRAST:  100mL OMNIPAQUE IOHEXOL 300 MG/ML  SOLN COMPARISON:  02/02/2021 FINDINGS: Lower chest: The lung bases are clear. Hepatobiliary: Surgical absence of the gallbladder. Small amount of fluid and stranding in the gallbladder fossa is likely postoperative. No loculated collection. No bile duct dilatation. No focal liver lesions. Pancreas: Unremarkable. No pancreatic ductal dilatation or surrounding inflammatory changes. Spleen: Normal in size without focal abnormality. Adrenals/Urinary Tract: Adrenal glands are unremarkable. Kidneys are normal, without renal calculi, focal lesion, or hydronephrosis. Bladder is unremarkable. Stomach/Bowel: The stomach, small bowel, and colon are not abnormally distended. Scattered diverticula in the colon. No evidence of acute diverticulitis. Appendix is normal. Vascular/Lymphatic: No significant vascular findings are present. No enlarged abdominal or pelvic lymph nodes. Reproductive: Uterus and ovaries are not enlarged. Involuting cyst in the right ovary. Other: No free air or free fluid in the abdomen. Minimal periumbilical hernia containing fat. Musculoskeletal: No acute or significant osseous findings. IMPRESSION: 1. Surgical absence of the gallbladder. Small amount of fluid and stranding in the gallbladder fossa is likely postoperative. No loculated collection. 2. No evidence of bowel obstruction or inflammation. 3. Involuting cyst in the right ovary. 4. Minimal periumbilical hernia containing fat. Electronically Signed   By: Burman NievesWilliam  Stevens M.D.   On: 02/20/2021 21:41   DG Chest Port 1 View  Result Date: 02/20/2021 CLINICAL DATA:  Epigastric pain.  Cholecystectomy 2 weeks ago. EXAM: PORTABLE CHEST 1 VIEW COMPARISON:  None. FINDINGS: The cardiomediastinal contours are normal. The lungs are  clear. Pulmonary vasculature is normal. No consolidation, pleural effusion, or pneumothorax. No acute osseous abnormalities are seen. IMPRESSION: Negative portable view of the chest. Electronically Signed   By: Narda RutherfordMelanie  Sanford M.D.   On: 02/20/2021 20:34   MR ABDOMEN MRCP W WO CONTAST  Result Date: 02/21/2021 CLINICAL DATA:  Jaundice with elevated liver function tests in a 35 year old female post cholecystectomy. EXAM: MRI ABDOMEN WITHOUT AND WITH CONTRAST (INCLUDING MRCP) TECHNIQUE: Multiplanar multisequence MR imaging of the abdomen was performed both before and after the administration of intravenous contrast. Heavily T2-weighted images of the biliary and pancreatic ducts were obtained, and three-dimensional MRCP images were rendered by post processing. CONTRAST:  10mL GADAVIST GADOBUTROL 1 MMOL/ML IV SOLN COMPARISON:  February 20, 2021 CT evaluation. FINDINGS: Lower chest: Limited assessment of the lung bases by MRI. No gross effusion or sign of consolidation. Hepatobiliary: Biliary tree nondilated with changes of cholecystectomy. No filling defect in the common bile duct. Small amount of fluid in the gallbladder fossa and  small amount of stranding about the area of postoperative change. Fluid measures 10 x 7 mm and is unchanged compared to the recent CT evaluation. No focal, suspicious hepatic lesion. Pancreas: Normal, without mass, inflammation or ductal dilatation. Normal intrinsic T1 signal. No signs of pancreatic divisum. Spleen: Spleen with normal size and contour. The no suspicious splenic lesion. Adrenals/Urinary Tract: Adrenal glands are normal. Symmetric renal enhancement. No hydronephrosis. Stomach/Bowel: Gastrointestinal tract unremarkable to the extent evaluated on abdominal MRI with limited assessment. Vascular/Lymphatic: Abdominal aorta is normal caliber. Smooth contour of the IVC. There is no gastrohepatic or hepatoduodenal ligament lymphadenopathy. No retroperitoneal or mesenteric  lymphadenopathy. Other: Trace perihepatic ascites. Trace fluid may be present along the RIGHT pericolic gutter. Musculoskeletal: No suspicious bone lesions identified. IMPRESSION: 1. Status post cholecystectomy. No biliary ductal dilatation or choledocholithiasis. 2. Small amount of fluid in the gallbladder fossa and small amount of stranding about the area of postoperative change. This is unchanged compared to the recent CT evaluation. 3. Trace perihepatic ascites. Trace fluid may also be present along the RIGHT pericolic gutter. Findings are nonspecific. If there is clinical concern for biliary leak nuclear medicine study may be helpful. Volume of fluid is very small in remains nonspecific. Electronically Signed   By: Donzetta Kohut M.D.   On: 02/21/2021 15:24        Scheduled Meds: Continuous Infusions: . lactated ringers    . promethazine (PHENERGAN) injection (IM or IVPB) 12.5 mg (02/21/21 1140)     LOS: 0 days     Jacquelin Hawking, MD [facility] 02/22/2021, 12:17 PM  If 7PM-7AM, please contact night-coverage [email or web address]

## 2021-02-22 NOTE — Progress Notes (Addendum)
Subjective/Chief Complaint: C/o gas pain in the mid abdomen. RUQ pain has resolved. Denies flatus or BM in several days   Objective: Vital signs in last 24 hours: Temp:  [97.6 F (36.4 C)-98.3 F (36.8 C)] 97.9 F (36.6 C) (04/30 0540) Pulse Rate:  [72-83] 79 (04/30 0540) Resp:  [12-19] 12 (04/30 0540) BP: (119-145)/(72-97) 119/72 (04/30 0540) SpO2:  [95 %-100 %] 96 % (04/30 0540) Last BM Date: 02/21/21  Intake/Output from previous day: 04/29 0701 - 04/30 0700 In: 410 [P.O.:360; IV Piggyback:50] Out: -  Intake/Output this shift: No intake/output data recorded.  General appearance: alert and cooperative Resp: unlabored Cardio: regular rate and rhythm GI: soft, incisions well healed, no RUQ tenderness, mild distention Skin: Skin color, texture, turgor normal. No rashes or lesions Neurologic: Grossly normal  Lab Results:  Recent Labs    02/21/21 0610 02/22/21 0459  WBC 8.7 6.6  HGB 12.1 12.0  HCT 37.8 37.3  PLT 243 230   BMET Recent Labs    02/21/21 0610 02/22/21 0459  NA 136 143  K 4.3 3.9  CL 105 112*  CO2 21* 27  GLUCOSE 140* 119*  BUN 10 8  CREATININE 0.70 0.75  CALCIUM 8.6* 8.9   PT/INR No results for input(s): LABPROT, INR in the last 72 hours. ABG No results for input(s): PHART, HCO3 in the last 72 hours.  Invalid input(s): PCO2, PO2  Studies/Results: CT Abdomen Pelvis W Contrast  Result Date: 02/20/2021 CLINICAL DATA:  Epigastric pain after gallbladder removal beginning of April. EXAM: CT ABDOMEN AND PELVIS WITH CONTRAST TECHNIQUE: Multidetector CT imaging of the abdomen and pelvis was performed using the standard protocol following bolus administration of intravenous contrast. CONTRAST:  145m OMNIPAQUE IOHEXOL 300 MG/ML  SOLN COMPARISON:  02/02/2021 FINDINGS: Lower chest: The lung bases are clear. Hepatobiliary: Surgical absence of the gallbladder. Small amount of fluid and stranding in the gallbladder fossa is likely postoperative. No  loculated collection. No bile duct dilatation. No focal liver lesions. Pancreas: Unremarkable. No pancreatic ductal dilatation or surrounding inflammatory changes. Spleen: Normal in size without focal abnormality. Adrenals/Urinary Tract: Adrenal glands are unremarkable. Kidneys are normal, without renal calculi, focal lesion, or hydronephrosis. Bladder is unremarkable. Stomach/Bowel: The stomach, small bowel, and colon are not abnormally distended. Scattered diverticula in the colon. No evidence of acute diverticulitis. Appendix is normal. Vascular/Lymphatic: No significant vascular findings are present. No enlarged abdominal or pelvic lymph nodes. Reproductive: Uterus and ovaries are not enlarged. Involuting cyst in the right ovary. Other: No free air or free fluid in the abdomen. Minimal periumbilical hernia containing fat. Musculoskeletal: No acute or significant osseous findings. IMPRESSION: 1. Surgical absence of the gallbladder. Small amount of fluid and stranding in the gallbladder fossa is likely postoperative. No loculated collection. 2. No evidence of bowel obstruction or inflammation. 3. Involuting cyst in the right ovary. 4. Minimal periumbilical hernia containing fat. Electronically Signed   By: WLucienne CapersM.D.   On: 02/20/2021 21:41   DG Chest Port 1 View  Result Date: 02/20/2021 CLINICAL DATA:  Epigastric pain.  Cholecystectomy 2 weeks ago. EXAM: PORTABLE CHEST 1 VIEW COMPARISON:  None. FINDINGS: The cardiomediastinal contours are normal. The lungs are clear. Pulmonary vasculature is normal. No consolidation, pleural effusion, or pneumothorax. No acute osseous abnormalities are seen. IMPRESSION: Negative portable view of the chest. Electronically Signed   By: MKeith RakeM.D.   On: 02/20/2021 20:34   MR ABDOMEN MRCP W WO CONTAST  Result Date: 02/21/2021 CLINICAL DATA:  Jaundice with elevated liver function tests in a 35 year old female post cholecystectomy. EXAM: MRI ABDOMEN WITHOUT  AND WITH CONTRAST (INCLUDING MRCP) TECHNIQUE: Multiplanar multisequence MR imaging of the abdomen was performed both before and after the administration of intravenous contrast. Heavily T2-weighted images of the biliary and pancreatic ducts were obtained, and three-dimensional MRCP images were rendered by post processing. CONTRAST:  52m GADAVIST GADOBUTROL 1 MMOL/ML IV SOLN COMPARISON:  February 20, 2021 CT evaluation. FINDINGS: Lower chest: Limited assessment of the lung bases by MRI. No gross effusion or sign of consolidation. Hepatobiliary: Biliary tree nondilated with changes of cholecystectomy. No filling defect in the common bile duct. Small amount of fluid in the gallbladder fossa and small amount of stranding about the area of postoperative change. Fluid measures 10 x 7 mm and is unchanged compared to the recent CT evaluation. No focal, suspicious hepatic lesion. Pancreas: Normal, without mass, inflammation or ductal dilatation. Normal intrinsic T1 signal. No signs of pancreatic divisum. Spleen: Spleen with normal size and contour. The no suspicious splenic lesion. Adrenals/Urinary Tract: Adrenal glands are normal. Symmetric renal enhancement. No hydronephrosis. Stomach/Bowel: Gastrointestinal tract unremarkable to the extent evaluated on abdominal MRI with limited assessment. Vascular/Lymphatic: Abdominal aorta is normal caliber. Smooth contour of the IVC. There is no gastrohepatic or hepatoduodenal ligament lymphadenopathy. No retroperitoneal or mesenteric lymphadenopathy. Other: Trace perihepatic ascites. Trace fluid may be present along the RIGHT pericolic gutter. Musculoskeletal: No suspicious bone lesions identified. IMPRESSION: 1. Status post cholecystectomy. No biliary ductal dilatation or choledocholithiasis. 2. Small amount of fluid in the gallbladder fossa and small amount of stranding about the area of postoperative change. This is unchanged compared to the recent CT evaluation. 3. Trace  perihepatic ascites. Trace fluid may also be present along the RIGHT pericolic gutter. Findings are nonspecific. If there is clinical concern for biliary leak nuclear medicine study may be helpful. Volume of fluid is very small in remains nonspecific. Electronically Signed   By: GZetta BillsM.D.   On: 02/21/2021 15:24    Anti-infectives: Anti-infectives (From admission, onward)   None      Assessment/Plan:  S/p Lap chole 4/11 ([place]) presented 4/29 with RUQ pain that felt like a gb attack Elevated liver enzymes CT 4/29 with small fluid/stranding in Gb fossa, involuting R ovarian cyst MRCP 4/29- no biliary ductal dilatation or choledocholithiasis. Stable fluid in gb fossa and trace perihepatic ascites/ possible trace fluid along right colic gutter  Bilirubin is up this morning, AST/ALT/alk phos stably elevated  HIDA Hepatitis panel Continue supportive care- simethicone prn, dulcolax suppository prn  Surgery will continue to follow     LOS: 0 days    CClovis Riley4/30/2022

## 2021-02-23 DIAGNOSIS — R1013 Epigastric pain: Secondary | ICD-10-CM | POA: Diagnosis not present

## 2021-02-23 LAB — COMPREHENSIVE METABOLIC PANEL
ALT: 312 U/L — ABNORMAL HIGH (ref 0–44)
AST: 173 U/L — ABNORMAL HIGH (ref 15–41)
Albumin: 3.8 g/dL (ref 3.5–5.0)
Alkaline Phosphatase: 148 U/L — ABNORMAL HIGH (ref 38–126)
Anion gap: 11 (ref 5–15)
BUN: 10 mg/dL (ref 6–20)
CO2: 21 mmol/L — ABNORMAL LOW (ref 22–32)
Calcium: 8.8 mg/dL — ABNORMAL LOW (ref 8.9–10.3)
Chloride: 102 mmol/L (ref 98–111)
Creatinine, Ser: 0.7 mg/dL (ref 0.44–1.00)
GFR, Estimated: >60 mL/min (ref >60)
Glucose, Bld: 73 mg/dL (ref 70–99)
Potassium: 3.6 mmol/L (ref 3.5–5.1)
Sodium: 134 mmol/L — ABNORMAL LOW (ref 135–145)
Total Bilirubin: 1.6 mg/dL — ABNORMAL HIGH (ref 0.3–1.2)
Total Protein: 7.2 g/dL (ref 6.5–8.1)

## 2021-02-23 LAB — CBC
HCT: 40.2 % (ref 36.0–46.0)
Hemoglobin: 12.9 g/dL (ref 12.0–15.0)
MCH: 27.8 pg (ref 26.0–34.0)
MCHC: 32.1 g/dL (ref 30.0–36.0)
MCV: 86.6 fL (ref 80.0–100.0)
Platelets: 235 10*3/uL (ref 150–400)
RBC: 4.64 MIL/uL (ref 3.87–5.11)
RDW: 13.6 % (ref 11.5–15.5)
WBC: 7.4 10*3/uL (ref 4.0–10.5)
nRBC: 0 % (ref 0.0–0.2)

## 2021-02-23 LAB — HEPATITIS PANEL, ACUTE
HCV Ab: NONREACTIVE
Hep A IgM: NONREACTIVE
Hep B C IgM: NONREACTIVE
Hepatitis B Surface Ag: NONREACTIVE

## 2021-02-23 LAB — MAGNESIUM: Magnesium: 1.9 mg/dL (ref 1.7–2.4)

## 2021-02-23 MED ORDER — PANTOPRAZOLE SODIUM 40 MG IV SOLR
40.0000 mg | Freq: Two times a day (BID) | INTRAVENOUS | Status: DC
  Administered 2021-02-23 – 2021-02-24 (×3): 40 mg via INTRAVENOUS
  Filled 2021-02-23 (×3): qty 40

## 2021-02-23 MED ORDER — SODIUM CHLORIDE 0.9 % IV SOLN: INTRAVENOUS | Status: DC

## 2021-02-23 MED ORDER — LORAZEPAM 2 MG/ML IJ SOLN
2.0000 mg | Freq: Once | INTRAMUSCULAR | Status: AC
  Administered 2021-02-23: 2 mg via INTRAVENOUS
  Filled 2021-02-23: qty 1

## 2021-02-23 MED ORDER — ALUM & MAG HYDROXIDE-SIMETH 200-200-20 MG/5ML PO SUSP
15.0000 mL | ORAL | Status: DC | PRN
  Administered 2021-02-23 (×2): 15 mL via ORAL
  Filled 2021-02-23 (×2): qty 30

## 2021-02-23 MED ORDER — MELATONIN 3 MG PO TABS
6.0000 mg | ORAL_TABLET | Freq: Every day | ORAL | Status: DC
  Administered 2021-02-23: 6 mg via ORAL
  Filled 2021-02-23: qty 2

## 2021-02-23 NOTE — Progress Notes (Signed)
She is very very upset with IV pump making noise, or any noise. crying, anxious, about to through pump out, afraid to have migraine headache. However, I stopped IV fluid for now. She c/O she needs to sleep but she is very anxious so she can't sleep. On call MD made aware of it. Will con't to monitor her.

## 2021-02-23 NOTE — Progress Notes (Signed)
Subjective/Chief Complaint: No pain currently, but hungry and having reflux. Further clarifies that the pain she experienced on presentation felt like a bad cramp that got bigger. Gesturing toward the epigastrium. States her grandfather had issues with esophageal spasm. Denies flatus or BM in several days.   Objective: Vital signs in last 24 hours: Temp:  [98.2 F (36.8 C)-98.5 F (36.9 C)] 98.2 F (36.8 C) (05/01 0515) Pulse Rate:  [73-82] 82 (05/01 0515) Resp:  [15-20] 16 (05/01 0515) BP: (115-142)/(68-90) 115/68 (05/01 0515) SpO2:  [96 %-98 %] 97 % (05/01 0515) Last BM Date: 02/20/21  Intake/Output from previous day: 04/30 0701 - 05/01 0700 In: 516.9 [I.V.:516.9] Out: -  Intake/Output this shift: No intake/output data recorded.  General appearance: alert and cooperative Resp: unlabored Cardio: regular rate and rhythm GI: soft, incisions well healed, no RUQ tenderness, no distention Skin: Skin color, texture, turgor normal. No rashes or lesions Neurologic: Grossly normal  Lab Results:  Recent Labs    02/22/21 0459 02/23/21 0600  WBC 6.6 7.4  HGB 12.0 12.9  HCT 37.3 40.2  PLT 230 235   BMET Recent Labs    02/22/21 0459 02/23/21 0600  NA 143 134*  K 3.9 3.6  CL 112* 102  CO2 27 21*  GLUCOSE 119* 73  BUN 8 10  CREATININE 0.75 0.70  CALCIUM 8.9 8.8*   PT/INR No results for input(s): LABPROT, INR in the last 72 hours. ABG No results for input(s): PHART, HCO3 in the last 72 hours.  Invalid input(s): PCO2, PO2  Studies/Results: MR ABDOMEN MRCP W WO CONTAST  Result Date: 02/21/2021 CLINICAL DATA:  Jaundice with elevated liver function tests in a 35 year old female post cholecystectomy. EXAM: MRI ABDOMEN WITHOUT AND WITH CONTRAST (INCLUDING MRCP) TECHNIQUE: Multiplanar multisequence MR imaging of the abdomen was performed both before and after the administration of intravenous contrast. Heavily T2-weighted images of the biliary and pancreatic ducts were  obtained, and three-dimensional MRCP images were rendered by post processing. CONTRAST:  35m GADAVIST GADOBUTROL 1 MMOL/ML IV SOLN COMPARISON:  February 20, 2021 CT evaluation. FINDINGS: Lower chest: Limited assessment of the lung bases by MRI. No gross effusion or sign of consolidation. Hepatobiliary: Biliary tree nondilated with changes of cholecystectomy. No filling defect in the common bile duct. Small amount of fluid in the gallbladder fossa and small amount of stranding about the area of postoperative change. Fluid measures 10 x 7 mm and is unchanged compared to the recent CT evaluation. No focal, suspicious hepatic lesion. Pancreas: Normal, without mass, inflammation or ductal dilatation. Normal intrinsic T1 signal. No signs of pancreatic divisum. Spleen: Spleen with normal size and contour. The no suspicious splenic lesion. Adrenals/Urinary Tract: Adrenal glands are normal. Symmetric renal enhancement. No hydronephrosis. Stomach/Bowel: Gastrointestinal tract unremarkable to the extent evaluated on abdominal MRI with limited assessment. Vascular/Lymphatic: Abdominal aorta is normal caliber. Smooth contour of the IVC. There is no gastrohepatic or hepatoduodenal ligament lymphadenopathy. No retroperitoneal or mesenteric lymphadenopathy. Other: Trace perihepatic ascites. Trace fluid may be present along the RIGHT pericolic gutter. Musculoskeletal: No suspicious bone lesions identified. IMPRESSION: 1. Status post cholecystectomy. No biliary ductal dilatation or choledocholithiasis. 2. Small amount of fluid in the gallbladder fossa and small amount of stranding about the area of postoperative change. This is unchanged compared to the recent CT evaluation. 3. Trace perihepatic ascites. Trace fluid may also be present along the RIGHT pericolic gutter. Findings are nonspecific. If there is clinical concern for biliary leak nuclear medicine study may be helpful.  Volume of fluid is very small in remains nonspecific.  Electronically Signed   By: Zetta Bills M.D.   On: 02/21/2021 15:24   NM HEPATOBILIARY LEAK (POST-SURGICAL)  Result Date: 02/22/2021 CLINICAL DATA:  Upper abdominal pain, nausea and vomiting for 2 days, cholecystectomy 02/03/2021 EXAM: NUCLEAR MEDICINE HEPATOBILIARY IMAGING TECHNIQUE: Sequential images of the abdomen were obtained out to 60 minutes following intravenous administration of radiopharmaceutical. RADIOPHARMACEUTICALS:  5.5 mCi Tc-16m Choletec IV COMPARISON:  02/20/2021, 02/21/2021 FINDINGS: There is prompt uptake of radiotracer by the hepatic parenchyma. Anterior imaging was performed over 2 hours. Radiotracer is seen outlining the common bile duct and small bowel. There is no abnormal radiotracer accumulation to suggest bile leak. IMPRESSION: 1. Normal hepatobiliary scan in a patient status post cholecystectomy. No evidence of bile leak. Electronically Signed   By: MRanda NgoM.D.   On: 02/22/2021 21:15    Anti-infectives: Anti-infectives (From admission, onward)   None      Assessment/Plan:  S/p Lap chole 4/11 ([place]) with negative intraoperative cholangiogram.  -presented 4/29 with RUQ pain/ epigastric pain that felt like a gb attack  -Elevated liver enzymes, no leukocytosis or fever CT 4/29 with small fluid/stranding in Gb fossa, involuting R ovarian cyst MRCP 4/29- no biliary ductal dilatation or choledocholithiasis. Stable fluid in gb fossa and trace perihepatic ascites/ possible trace fluid along right colic gutter HIDA 48/87 negative for bile leak or obstruction Acute hepatitis panel 4/30- negative  Bilirubin is down to 1.6 (2.3 yesterday, 1.2 on presentation), AST/ALT downtrending (173/312 from 339/371), Alk phos up very slightly to 148  A-t this point has had an extensive workup without a surgical etiology identified to explain her elevated LFTs. Small volume fluid in the gb fossa is unlikely to have caused the pain and infection is unlikely given over-all  clinical picture.  Consider GI consult for upper endoscopy to evaluate for source of her epigastric pain/further workup of elevated LFTs. I've ordered protonix to see if this will help.      LOS: 1 day    CClovis Riley5/10/2020

## 2021-02-23 NOTE — Anesthesia Preprocedure Evaluation (Addendum)
Anesthesia Evaluation  Patient identified by MRN, date of birth, ID band Patient awake    Reviewed: Allergy & Precautions, NPO status , Patient's Chart, lab work & pertinent test results  History of Anesthesia Complications Negative for: history of anesthetic complications  Airway Mallampati: II  TM Distance: >3 FB Neck ROM: Full    Dental no notable dental hx. (+) Dental Advisory Given   Pulmonary neg pulmonary ROS,    Pulmonary exam normal        Cardiovascular negative cardio ROS Normal cardiovascular exam     Neuro/Psych  Headaches, negative psych ROS   GI/Hepatic negative GI ROS, Neg liver ROS,   Endo/Other  Morbid obesity (BMI 48)PCOS  Renal/GU negative Renal ROS  negative genitourinary   Musculoskeletal negative musculoskeletal ROS (+)   Abdominal   Peds  Hematology negative hematology ROS (+)   Anesthesia Other Findings   Reproductive/Obstetrics                            Anesthesia Physical  Anesthesia Plan  ASA: III  Anesthesia Plan: MAC   Post-op Pain Management:    Induction:   PONV Risk Score and Plan: 2 and Ondansetron, Propofol infusion and Treatment may vary due to age or medical condition  Airway Management Planned: Natural Airway  Additional Equipment: None  Intra-op Plan:   Post-operative Plan:   Informed Consent: I have reviewed the patients History and Physical, chart, labs and discussed the procedure including the risks, benefits and alternatives for the proposed anesthesia with the patient or authorized representative who has indicated his/her understanding and acceptance.     Dental advisory given  Plan Discussed with: Anesthesiologist and CRNA  Anesthesia Plan Comments:        Anesthesia Quick Evaluation

## 2021-02-23 NOTE — H&P (View-Only) (Signed)
Referring Provider: Dr. Caleb Popp Primary Care Physician:  Laurann Montana, MD Primary Gastroenterologist:  Gentry Fitz  Reason for Consultation:  Epigastric pain; Elevated LFTs  HPI: Jill Bass is a 35 y.o. female with recent lap chole 02/03/21 with partial IOC. Common bile duct was partially visualized on IOC due to leakage of contrast around catheter in cystic duct. A stone was removed from the cystic duct during surgery. She was discharged on 02/04/21 and did ok until last Thursday (02/20/21) when she had the acute onset of 10/10 doubling over sharp epigastric pain identical to the pain she had preop. Nausea without vomiting. Denies changes in BMs, F/C. CT, MRCP showed a normal postop appearance without any choledocholithiasis or biliary ductal dilation. Small amount of GB fossa fluid thought to be normal postop fluid. HIDA scan negative for bile leak. On discharge 4/12: AST 45, ALT 51 and 4/28: AST 56, ALT 38. 4/29: AST 376, ALT 253, 4/30: AST 339, ALT 371 and LFTs improving today with AST 173, ALT 312. On admit lipase 37, ALP 65, TB 1.2. Peaks this admit thus far TB 2.3, ALP 148. Sleeping comfortably upon my arrival to room. Mother at bedside. Nurse tech present during my evaluation.  Past Medical History:  Diagnosis Date  . Kidney stones   . Migraine   . PCOS (polycystic ovarian syndrome)     Past Surgical History:  Procedure Laterality Date  . CHOLECYSTECTOMY N/A 02/03/2021   Procedure: LAPAROSCOPIC CHOLECYSTECTOMY WITH INTRAOPERATIVE CHOLANGIOGRAM;  Surgeon: Manus Rudd, MD;  Location: [facility] OR;  Service: General;  Laterality: N/A;  . TYMPANOSTOMY TUBE PLACEMENT      Prior to Admission medications   Medication Sig Start Date End Date Taking? Authorizing Provider  ibuprofen (ADVIL) 200 MG tablet Take 200 mg by mouth every 6 (six) hours as needed for mild pain.   Yes [provider]  simethicone (MYLICON) 125 MG chewable tablet Chew 125 mg by mouth every 6 (six) hours as needed.  gas   Yes [provider]  oxyCODONE (OXY IR/ROXICODONE) 5 MG immediate release tablet Take 1 tablet (5 mg total) by mouth every 6 (six) hours as needed for breakthrough pain. Patient not taking: Reported on 02/21/2021 02/04/21   Jacinto Halim, PA-C    Scheduled Meds: . pantoprazole (PROTONIX) IV  40 mg Intravenous Q12H   Continuous Infusions: . lactated ringers 100 mL/hr at 02/23/21 0420  . promethazine (PHENERGAN) injection (IM or IVPB) 12.5 mg (02/21/21 1140)   PRN Meds:.alum & mag hydroxide-simeth, bisacodyl, HYDROmorphone (DILAUDID) injection, ondansetron (ZOFRAN) IV, promethazine (PHENERGAN) injection (IM or IVPB), simethicone  Allergies as of 02/20/2021 - Review Complete 02/20/2021  Allergen Reaction Noted  . Penicillins Hives 10/05/2011  . Latex Rash 02/03/2021    History reviewed. No pertinent family history.  Social History   Socioeconomic History  . Marital status: Single    Spouse name: Not on file  . Number of children: Not on file  . Years of education: Not on file  . Highest education level: Not on file  Occupational History  . Not on file  Tobacco Use  . Smoking status: Never Smoker  . Smokeless tobacco: Never Used  Vaping Use  . Vaping Use: Never used  Substance and Sexual Activity  . Alcohol use: Yes  . Drug use: No  . Sexual activity: Not on file  Other Topics Concern  . Not on file  Social History Narrative  . Not on file   Social Determinants of Health   Financial  Resource Strain: Not on file  Food Insecurity: Not on file  Transportation Needs: Not on file  Physical Activity: Not on file  Stress: Not on file  Social Connections: Not on file  Intimate Partner Violence: Not on file    Review of Systems: All negative except as stated above in HPI.  Physical Exam: Vital signs: Vitals:   02/22/21 2110 02/23/21 0515  BP: 137/90 115/68  Pulse: 73 82  Resp: 15 16  Temp: 98.5 F (36.9 C) 98.2 F (36.8 C)  SpO2: 96% 97%    Last BM Date: 02/20/21 General:   Lethargic, obese, no acute distress, pleasant Head: normocephalic, atraumatic Eyes: anicteric sclera ENT: oropharynx clear Neck: supple, nontender Lungs:  Clear throughout to auscultation.   No wheezes, crackles, or rhonchi. No acute distress. Heart:  Regular rate and rhythm; no murmurs, clicks, rubs,  or gallops. Abdomen: epigastric tenderness with minimal guarding, RLQ tenderness with guarding, otherwise nontender, soft, nondistended, +BS  Rectal:  Deferred Ext: no edema  GI:  Lab Results: Recent Labs    02/21/21 0610 02/22/21 0459 02/23/21 0600  WBC 8.7 6.6 7.4  HGB 12.1 12.0 12.9  HCT 37.8 37.3 40.2  PLT 243 230 235   BMET Recent Labs    02/21/21 0610 02/22/21 0459 02/23/21 0600  NA 136 143 134*  K 4.3 3.9 3.6  CL 105 112* 102  CO2 21* 27 21*  GLUCOSE 140* 119* 73  BUN 10 8 10   CREATININE 0.70 0.75 0.70  CALCIUM 8.6* 8.9 8.8*   LFT Recent Labs    02/21/21 0610 02/22/21 0459 02/23/21 0600  PROT 7.1   < > 7.2  ALBUMIN 3.8   < > 3.8  AST 376*   < > 173*  ALT 253*   < > 312*  ALKPHOS 103   < > 148*  BILITOT 1.4*   < > 1.6*  BILIDIR 0.7*  --   --   IBILI 0.7  --   --    < > = values in this interval not displayed.   PT/INR No results for input(s): LABPROT, INR in the last 72 hours.   Studies/Results: MR ABDOMEN MRCP W WO CONTAST  Result Date: 02/21/2021 CLINICAL DATA:  Jaundice with elevated liver function tests in a 35 year old female post cholecystectomy. EXAM: MRI ABDOMEN WITHOUT AND WITH CONTRAST (INCLUDING MRCP) TECHNIQUE: Multiplanar multisequence MR imaging of the abdomen was performed both before and after the administration of intravenous contrast. Heavily T2-weighted images of the biliary and pancreatic ducts were obtained, and three-dimensional MRCP images were rendered by post processing. CONTRAST:  24mL GADAVIST GADOBUTROL 1 MMOL/ML IV SOLN COMPARISON:  February 20, 2021 CT evaluation. FINDINGS: Lower chest:  Limited assessment of the lung bases by MRI. No gross effusion or sign of consolidation. Hepatobiliary: Biliary tree nondilated with changes of cholecystectomy. No filling defect in the common bile duct. Small amount of fluid in the gallbladder fossa and small amount of stranding about the area of postoperative change. Fluid measures 10 x 7 mm and is unchanged compared to the recent CT evaluation. No focal, suspicious hepatic lesion. Pancreas: Normal, without mass, inflammation or ductal dilatation. Normal intrinsic T1 signal. No signs of pancreatic divisum. Spleen: Spleen with normal size and contour. The no suspicious splenic lesion. Adrenals/Urinary Tract: Adrenal glands are normal. Symmetric renal enhancement. No hydronephrosis. Stomach/Bowel: Gastrointestinal tract unremarkable to the extent evaluated on abdominal MRI with limited assessment. Vascular/Lymphatic: Abdominal aorta is normal caliber. Smooth contour of the IVC. There is  no gastrohepatic or hepatoduodenal ligament lymphadenopathy. No retroperitoneal or mesenteric lymphadenopathy. Other: Trace perihepatic ascites. Trace fluid may be present along the RIGHT pericolic gutter. Musculoskeletal: No suspicious bone lesions identified. IMPRESSION: 1. Status post cholecystectomy. No biliary ductal dilatation or choledocholithiasis. 2. Small amount of fluid in the gallbladder fossa and small amount of stranding about the area of postoperative change. This is unchanged compared to the recent CT evaluation. 3. Trace perihepatic ascites. Trace fluid may also be present along the RIGHT pericolic gutter. Findings are nonspecific. If there is clinical concern for biliary leak nuclear medicine study may be helpful. Volume of fluid is very small in remains nonspecific. Electronically Signed   By: Donzetta Kohut M.D.   On: 02/21/2021 15:24   NM HEPATOBILIARY LEAK (POST-SURGICAL)  Result Date: 02/22/2021 CLINICAL DATA:  Upper abdominal pain, nausea and vomiting for  2 days, cholecystectomy 02/03/2021 EXAM: NUCLEAR MEDICINE HEPATOBILIARY IMAGING TECHNIQUE: Sequential images of the abdomen were obtained out to 60 minutes following intravenous administration of radiopharmaceutical. RADIOPHARMACEUTICALS:  5.5 mCi Tc-28m  Choletec IV COMPARISON:  02/20/2021, 02/21/2021 FINDINGS: There is prompt uptake of radiotracer by the hepatic parenchyma. Anterior imaging was performed over 2 hours. Radiotracer is seen outlining the common bile duct and small bowel. There is no abnormal radiotracer accumulation to suggest bile leak. IMPRESSION: 1. Normal hepatobiliary scan in a patient status post cholecystectomy. No evidence of bile leak. Electronically Signed   By: Sharlet Salina M.D.   On: 02/22/2021 21:15    Impression/Plan: Epigastric pain and elevated LFTs 2 weeks after lap chole with suboptimal IOC. I suspect she passed a stone causing her presentation and doubt a peptic ulcer or intrahepatic source. LFTs improving and I do not think an ERCP is needed at this time. Will do an EGD tomorrow morning to rule out peptic ulcer disease due to the intensity of her abdominal pain but I think she most likely passed a retained gallstone. Clear liquid diet this evening after dinner and NPO p MN. EGD tomorrow at 9AM with Dr. Levora Angel. If that is unrevealing and LFTs continue to normalize, then she should be able to go home tomorrow afternoon with close outpt f/u of her LFTs.     LOS: 1 day   Shirley Friar  02/23/2021, 2:09 PM  Questions please call [phone]

## 2021-02-23 NOTE — Progress Notes (Signed)
Paged on call provider twice and spoke with once on the phone requesting diet order. Still awaiting orders.

## 2021-02-23 NOTE — Progress Notes (Signed)
PROGRESS NOTE    Jill Bass  YJW:[id number] DOB: May 08, 1986 DOA: 02/20/2021 PCP: Laurann Montana, MD   Brief Narrative: Jill Bass is a 35 y.o. female with a history of biliary colic status post atraumatic cholecystectomy. Patient presented with abdominal pain with concern for possible bile leak.   Assessment & Plan:   Principal Problem:   Intractable epigastric abdominal pain Active Problems:   Abdominal pain   Abdominal pain CT abdomen/pelvis was negative for loculated fluid. AST/ALT/Bilirubin elevated and trending down. General surgery consulted. MRCP without evidence of stone or ductal dilation. HIDA scan negative. Hepatitis panel negative.  -Pain management -Daily CMP -GI consult  Hyponatremia Mild.   DVT prophylaxis: SCDs Code Status:   Code Status: Full Code Family Communication: Mother at bedside Disposition Plan: Discharge home pending continued workup for abdominal pain, general surgery recommendations   Consultants:   General surgery  Procedures:   None  Antimicrobials:  None    Subjective: Pain is improved today. Eager to eat.  Objective: Vitals:   02/22/21 0540 02/22/21 1307 02/22/21 2110 02/23/21 0515  BP: 119/72 (!) 142/80 137/90 115/68  Pulse: 79 79 73 82  Resp: 12 20 15 16   Temp: 97.9 F (36.6 C) 98.2 F (36.8 C) 98.5 F (36.9 C) 98.2 F (36.8 C)  TempSrc: Oral Oral Oral Oral  SpO2: 96% 98% 96% 97%  Weight:      Height:        Intake/Output Summary (Last 24 hours) at 02/23/2021 1133 Last data filed at 02/23/2021 0420 Gross per 24 hour  Intake 516.92 ml  Output --  Net 516.92 ml   Filed Weights   02/20/21 1948 02/21/21 0209  Weight: 115.2 kg 114.7 kg    Examination:  General exam: Appears calm and comfortable Respiratory system: Clear to auscultation. Respiratory effort normal. Cardiovascular system: S1 & S2 heard, RRR. No murmurs, rubs, gallops or clicks. Gastrointestinal system: Abdomen is nondistended,  soft and nontender. No organomegaly or masses felt. Normal bowel sounds heard. Central nervous system: Alert and oriented. No focal neurological deficits. Musculoskeletal: No edema. No calf tenderness Skin: No cyanosis. Left neck erythema with underlying induration Psychiatry: Judgement and insight appear normal. Mood & affect appropriate.     Data Reviewed: I have personally reviewed following labs and imaging studies  CBC Lab Results  Component Value Date   WBC 7.4 02/23/2021   RBC 4.64 02/23/2021   HGB 12.9 02/23/2021   HCT 40.2 02/23/2021   MCV 86.6 02/23/2021   MCH 27.8 02/23/2021   PLT 235 02/23/2021   MCHC 32.1 02/23/2021   RDW 13.6 02/23/2021   LYMPHSABS 0.9 02/21/2021   MONOABS 0.4 02/21/2021   EOSABS 0.0 02/21/2021   BASOSABS 0.0 02/21/2021     Last metabolic panel Lab Results  Component Value Date   NA 134 (L) 02/23/2021   K 3.6 02/23/2021   CL 102 02/23/2021   CO2 21 (L) 02/23/2021   BUN 10 02/23/2021   CREATININE 0.70 02/23/2021   GLUCOSE 73 02/23/2021   GFRNONAA >60 02/23/2021   GFRAA >90 10/05/2011   CALCIUM 8.8 (L) 02/23/2021   PROT 7.2 02/23/2021   ALBUMIN 3.8 02/23/2021   BILITOT 1.6 (H) 02/23/2021   ALKPHOS 148 (H) 02/23/2021   AST 173 (H) 02/23/2021   ALT 312 (H) 02/23/2021   ANIONGAP 11 02/23/2021    CBG (last 3)  No results for input(s): GLUCAP in the last 72 hours.   GFR: Estimated Creatinine Clearance: 116.5 mL/min (by  C-G formula based on SCr of 0.7 mg/dL).  Coagulation Profile: No results for input(s): INR, PROTIME in the last 168 hours.  Recent Results (from the past 240 hour(s))  Culture, blood (Routine X 2) w Reflex to ID Panel     Status: None (Preliminary result)   Collection Time: 02/20/21  8:11 PM   Specimen: BLOOD  Result Value Ref Range Status   Specimen Description   Final    BLOOD RIGHT ANTECUBITAL Performed at [facility], [address] [place], [place] [address]    Special Requests   Final     BOTTLES DRAWN AEROBIC AND ANAEROBIC Blood Culture adequate volume Performed at [facility], [address] [place], [place] [address]    Culture   Final    NO GROWTH 2 DAYS Performed at [facility], [address] [place], [place] [address]    Report Status PENDING  Incomplete  Culture, blood (Routine X 2) w Reflex to ID Panel     Status: None (Preliminary result)   Collection Time: 02/20/21  8:35 PM   Specimen: BLOOD  Result Value Ref Range Status   Specimen Description   Final    BLOOD LEFT ANTECUBITAL Performed at [facility], [address] [place], [place] [address]    Special Requests   Final    BOTTLES DRAWN AEROBIC AND ANAEROBIC Blood Culture adequate volume Performed at [facility], [address] [place], [place] [address]    Culture   Final    NO GROWTH 2 DAYS Performed at [facility], [address] [place], [place] [address]    Report Status PENDING  Incomplete  Resp Panel by RT-PCR (Flu A&B, Covid)     Status: None   Collection Time: 02/21/21 12:02 AM   Specimen: Nasopharyngeal(NP) swabs in vial transport medium  Result Value Ref Range Status   SARS Coronavirus 2 by RT PCR NEGATIVE NEGATIVE Final    Comment: (NOTE) SARS-CoV-2 target nucleic acids are NOT DETECTED.  The SARS-CoV-2 RNA is generally detectable in upper respiratory specimens during the acute phase of infection. The lowest concentration of SARS-CoV-2 viral copies this assay can detect is 138 copies/mL. A negative result does not preclude SARS-Cov-2 infection and should not be used as the sole basis for treatment or other patient management decisions. A negative result may occur with  improper specimen collection/handling, submission of specimen other than nasopharyngeal swab, presence of viral mutation(s) within the areas targeted by this assay, and inadequate number of viral copies(<138 copies/mL). A negative result must be combined  with clinical observations, patient history, and epidemiological information. The expected result is Negative.  Fact Sheet for Patients:  BloggerCourse.com  Fact Sheet for Healthcare Providers:  SeriousBroker.it  This test is no t yet approved or cleared by the [place] FDA and  has been authorized for detection and/or diagnosis of SARS-CoV-2 by FDA under an Emergency Use Authorization (EUA). This EUA will remain  in effect (meaning this test can be used) for the duration of the COVID-19 declaration under Section 564(b)(1) of the Act, 21 U.S.C.section 360bbb-3(b)(1), unless the authorization is terminated  or revoked sooner.       Influenza A by PCR NEGATIVE NEGATIVE Final   Influenza B by PCR NEGATIVE NEGATIVE Final    Comment: (NOTE) The Xpert Xpress SARS-CoV-2/FLU/RSV plus assay is intended as an aid in the diagnosis of influenza from Nasopharyngeal swab specimens  and should not be used as a sole basis for treatment. Nasal washings and aspirates are unacceptable for Xpert Xpress SARS-CoV-2/FLU/RSV testing.  Fact Sheet for Patients: BloggerCourse.comhttps://www.fda.gov/media/152166/download  Fact Sheet for Healthcare Providers: SeriousBroker.ithttps://www.fda.gov/media/152162/download  This test is not yet approved or cleared by the [place] FDA and has been authorized for detection and/or diagnosis of SARS-CoV-2 by FDA under an Emergency Use Authorization (EUA). This EUA will remain in effect (meaning this test can be used) for the duration of the COVID-19 declaration under Section 564(b)(1) of the Act, 21 U.S.C. section 360bbb-3(b)(1), unless the authorization is terminated or revoked.  Performed at [facility], [address] [place], [place] [id number]         Radiology Studies: MR ABDOMEN MRCP W WO CONTAST  Result Date: 02/21/2021 CLINICAL DATA:  Jaundice with elevated liver function tests in a 35 year old female post  cholecystectomy. EXAM: MRI ABDOMEN WITHOUT AND WITH CONTRAST (INCLUDING MRCP) TECHNIQUE: Multiplanar multisequence MR imaging of the abdomen was performed both before and after the administration of intravenous contrast. Heavily T2-weighted images of the biliary and pancreatic ducts were obtained, and three-dimensional MRCP images were rendered by post processing. CONTRAST:  10mL GADAVIST GADOBUTROL 1 MMOL/ML IV SOLN COMPARISON:  February 20, 2021 CT evaluation. FINDINGS: Lower chest: Limited assessment of the lung bases by MRI. No gross effusion or sign of consolidation. Hepatobiliary: Biliary tree nondilated with changes of cholecystectomy. No filling defect in the common bile duct. Small amount of fluid in the gallbladder fossa and small amount of stranding about the area of postoperative change. Fluid measures 10 x 7 mm and is unchanged compared to the recent CT evaluation. No focal, suspicious hepatic lesion. Pancreas: Normal, without mass, inflammation or ductal dilatation. Normal intrinsic T1 signal. No signs of pancreatic divisum. Spleen: Spleen with normal size and contour. The no suspicious splenic lesion. Adrenals/Urinary Tract: Adrenal glands are normal. Symmetric renal enhancement. No hydronephrosis. Stomach/Bowel: Gastrointestinal tract unremarkable to the extent evaluated on abdominal MRI with limited assessment. Vascular/Lymphatic: Abdominal aorta is normal caliber. Smooth contour of the IVC. There is no gastrohepatic or hepatoduodenal ligament lymphadenopathy. No retroperitoneal or mesenteric lymphadenopathy. Other: Trace perihepatic ascites. Trace fluid may be present along the RIGHT pericolic gutter. Musculoskeletal: No suspicious bone lesions identified. IMPRESSION: 1. Status post cholecystectomy. No biliary ductal dilatation or choledocholithiasis. 2. Small amount of fluid in the gallbladder fossa and small amount of stranding about the area of postoperative change. This is unchanged compared to  the recent CT evaluation. 3. Trace perihepatic ascites. Trace fluid may also be present along the RIGHT pericolic gutter. Findings are nonspecific. If there is clinical concern for biliary leak nuclear medicine study may be helpful. Volume of fluid is very small in remains nonspecific. Electronically Signed   By: Donzetta KohutGeoffrey  Wile M.D.   On: 02/21/2021 15:24   NM HEPATOBILIARY LEAK (POST-SURGICAL)  Result Date: 02/22/2021 CLINICAL DATA:  Upper abdominal pain, nausea and vomiting for 2 days, cholecystectomy 02/03/2021 EXAM: NUCLEAR MEDICINE HEPATOBILIARY IMAGING TECHNIQUE: Sequential images of the abdomen were obtained out to 60 minutes following intravenous administration of radiopharmaceutical. RADIOPHARMACEUTICALS:  5.5 mCi Tc-3155m  Choletec IV COMPARISON:  02/20/2021, 02/21/2021 FINDINGS: There is prompt uptake of radiotracer by the hepatic parenchyma. Anterior imaging was performed over 2 hours. Radiotracer is seen outlining the common bile duct and small bowel. There is no abnormal radiotracer accumulation to suggest bile leak. IMPRESSION: 1. Normal hepatobiliary scan in a patient status post cholecystectomy. No evidence of bile leak. Electronically Signed  By: Sharlet Salina M.D.   On: 02/22/2021 21:15        Scheduled Meds: . pantoprazole (PROTONIX) IV  40 mg Intravenous Q12H   Continuous Infusions: . lactated ringers 100 mL/hr at 02/23/21 0420  . promethazine (PHENERGAN) injection (IM or IVPB) 12.5 mg (02/21/21 1140)     LOS: 1 day     Jacquelin Hawking, MD [facility] 02/23/2021, 11:33 AM  If 7PM-7AM, please contact night-coverage [email or web address]

## 2021-02-23 NOTE — Consult Note (Signed)
Referring Provider: Dr. Caleb Popp Primary Care Physician:  Laurann Montana, MD Primary Gastroenterologist:  Gentry Fitz  Reason for Consultation:  Epigastric pain; Elevated LFTs  HPI: Jill Bass is a 35 y.o. female with recent lap chole 02/03/21 with partial IOC. Common bile duct was partially visualized on IOC due to leakage of contrast around catheter in cystic duct. A stone was removed from the cystic duct during surgery. She was discharged on 02/04/21 and did ok until last Thursday (02/20/21) when she had the acute onset of 10/10 doubling over sharp epigastric pain identical to the pain she had preop. Nausea without vomiting. Denies changes in BMs, F/C. CT, MRCP showed a normal postop appearance without any choledocholithiasis or biliary ductal dilation. Small amount of GB fossa fluid thought to be normal postop fluid. HIDA scan negative for bile leak. On discharge 4/12: AST 45, ALT 51 and 4/28: AST 56, ALT 38. 4/29: AST 376, ALT 253, 4/30: AST 339, ALT 371 and LFTs improving today with AST 173, ALT 312. On admit lipase 37, ALP 65, TB 1.2. Peaks this admit thus far TB 2.3, ALP 148. Sleeping comfortably upon my arrival to room. Mother at bedside. Nurse tech present during my evaluation.  Past Medical History:  Diagnosis Date  . Kidney stones   . Migraine   . PCOS (polycystic ovarian syndrome)     Past Surgical History:  Procedure Laterality Date  . CHOLECYSTECTOMY N/A 02/03/2021   Procedure: LAPAROSCOPIC CHOLECYSTECTOMY WITH INTRAOPERATIVE CHOLANGIOGRAM;  Surgeon: Manus Rudd, MD;  Location: [facility] OR;  Service: General;  Laterality: N/A;  . TYMPANOSTOMY TUBE PLACEMENT      Prior to Admission medications   Medication Sig Start Date End Date Taking? Authorizing Provider  ibuprofen (ADVIL) 200 MG tablet Take 200 mg by mouth every 6 (six) hours as needed for mild pain.   Yes [provider]  simethicone (MYLICON) 125 MG chewable tablet Chew 125 mg by mouth every 6 (six) hours as needed.  gas   Yes [provider]  oxyCODONE (OXY IR/ROXICODONE) 5 MG immediate release tablet Take 1 tablet (5 mg total) by mouth every 6 (six) hours as needed for breakthrough pain. Patient not taking: Reported on 02/21/2021 02/04/21   Jacinto Halim, PA-C    Scheduled Meds: . pantoprazole (PROTONIX) IV  40 mg Intravenous Q12H   Continuous Infusions: . lactated ringers 100 mL/hr at 02/23/21 0420  . promethazine (PHENERGAN) injection (IM or IVPB) 12.5 mg (02/21/21 1140)   PRN Meds:.alum & mag hydroxide-simeth, bisacodyl, HYDROmorphone (DILAUDID) injection, ondansetron (ZOFRAN) IV, promethazine (PHENERGAN) injection (IM or IVPB), simethicone  Allergies as of 02/20/2021 - Review Complete 02/20/2021  Allergen Reaction Noted  . Penicillins Hives 10/05/2011  . Latex Rash 02/03/2021    History reviewed. No pertinent family history.  Social History   Socioeconomic History  . Marital status: Single    Spouse name: Not on file  . Number of children: Not on file  . Years of education: Not on file  . Highest education level: Not on file  Occupational History  . Not on file  Tobacco Use  . Smoking status: Never Smoker  . Smokeless tobacco: Never Used  Vaping Use  . Vaping Use: Never used  Substance and Sexual Activity  . Alcohol use: Yes  . Drug use: No  . Sexual activity: Not on file  Other Topics Concern  . Not on file  Social History Narrative  . Not on file   Social Determinants of Health   Financial  Resource Strain: Not on file  Food Insecurity: Not on file  Transportation Needs: Not on file  Physical Activity: Not on file  Stress: Not on file  Social Connections: Not on file  Intimate Partner Violence: Not on file    Review of Systems: All negative except as stated above in HPI.  Physical Exam: Vital signs: Vitals:   02/22/21 2110 02/23/21 0515  BP: 137/90 115/68  Pulse: 73 82  Resp: 15 16  Temp: 98.5 F (36.9 C) 98.2 F (36.8 C)  SpO2: 96% 97%    Last BM Date: 02/20/21 General:   Lethargic, obese, no acute distress, pleasant Head: normocephalic, atraumatic Eyes: anicteric sclera ENT: oropharynx clear Neck: supple, nontender Lungs:  Clear throughout to auscultation.   No wheezes, crackles, or rhonchi. No acute distress. Heart:  Regular rate and rhythm; no murmurs, clicks, rubs,  or gallops. Abdomen: epigastric tenderness with minimal guarding, RLQ tenderness with guarding, otherwise nontender, soft, nondistended, +BS  Rectal:  Deferred Ext: no edema  GI:  Lab Results: Recent Labs    02/21/21 0610 02/22/21 0459 02/23/21 0600  WBC 8.7 6.6 7.4  HGB 12.1 12.0 12.9  HCT 37.8 37.3 40.2  PLT 243 230 235   BMET Recent Labs    02/21/21 0610 02/22/21 0459 02/23/21 0600  NA 136 143 134*  K 4.3 3.9 3.6  CL 105 112* 102  CO2 21* 27 21*  GLUCOSE 140* 119* 73  BUN 10 8 10   CREATININE 0.70 0.75 0.70  CALCIUM 8.6* 8.9 8.8*   LFT Recent Labs    02/21/21 0610 02/22/21 0459 02/23/21 0600  PROT 7.1   < > 7.2  ALBUMIN 3.8   < > 3.8  AST 376*   < > 173*  ALT 253*   < > 312*  ALKPHOS 103   < > 148*  BILITOT 1.4*   < > 1.6*  BILIDIR 0.7*  --   --   IBILI 0.7  --   --    < > = values in this interval not displayed.   PT/INR No results for input(s): LABPROT, INR in the last 72 hours.   Studies/Results: MR ABDOMEN MRCP W WO CONTAST  Result Date: 02/21/2021 CLINICAL DATA:  Jaundice with elevated liver function tests in a 35 year old female post cholecystectomy. EXAM: MRI ABDOMEN WITHOUT AND WITH CONTRAST (INCLUDING MRCP) TECHNIQUE: Multiplanar multisequence MR imaging of the abdomen was performed both before and after the administration of intravenous contrast. Heavily T2-weighted images of the biliary and pancreatic ducts were obtained, and three-dimensional MRCP images were rendered by post processing. CONTRAST:  24mL GADAVIST GADOBUTROL 1 MMOL/ML IV SOLN COMPARISON:  February 20, 2021 CT evaluation. FINDINGS: Lower chest:  Limited assessment of the lung bases by MRI. No gross effusion or sign of consolidation. Hepatobiliary: Biliary tree nondilated with changes of cholecystectomy. No filling defect in the common bile duct. Small amount of fluid in the gallbladder fossa and small amount of stranding about the area of postoperative change. Fluid measures 10 x 7 mm and is unchanged compared to the recent CT evaluation. No focal, suspicious hepatic lesion. Pancreas: Normal, without mass, inflammation or ductal dilatation. Normal intrinsic T1 signal. No signs of pancreatic divisum. Spleen: Spleen with normal size and contour. The no suspicious splenic lesion. Adrenals/Urinary Tract: Adrenal glands are normal. Symmetric renal enhancement. No hydronephrosis. Stomach/Bowel: Gastrointestinal tract unremarkable to the extent evaluated on abdominal MRI with limited assessment. Vascular/Lymphatic: Abdominal aorta is normal caliber. Smooth contour of the IVC. There is  no gastrohepatic or hepatoduodenal ligament lymphadenopathy. No retroperitoneal or mesenteric lymphadenopathy. Other: Trace perihepatic ascites. Trace fluid may be present along the RIGHT pericolic gutter. Musculoskeletal: No suspicious bone lesions identified. IMPRESSION: 1. Status post cholecystectomy. No biliary ductal dilatation or choledocholithiasis. 2. Small amount of fluid in the gallbladder fossa and small amount of stranding about the area of postoperative change. This is unchanged compared to the recent CT evaluation. 3. Trace perihepatic ascites. Trace fluid may also be present along the RIGHT pericolic gutter. Findings are nonspecific. If there is clinical concern for biliary leak nuclear medicine study may be helpful. Volume of fluid is very small in remains nonspecific. Electronically Signed   By: Geoffrey  Wile M.D.   On: 02/21/2021 15:24   NM HEPATOBILIARY LEAK (POST-SURGICAL)  Result Date: 02/22/2021 CLINICAL DATA:  Upper abdominal pain, nausea and vomiting for  2 days, cholecystectomy 02/03/2021 EXAM: NUCLEAR MEDICINE HEPATOBILIARY IMAGING TECHNIQUE: Sequential images of the abdomen were obtained out to 60 minutes following intravenous administration of radiopharmaceutical. RADIOPHARMACEUTICALS:  5.5 mCi Tc-99m  Choletec IV COMPARISON:  02/20/2021, 02/21/2021 FINDINGS: There is prompt uptake of radiotracer by the hepatic parenchyma. Anterior imaging was performed over 2 hours. Radiotracer is seen outlining the common bile duct and small bowel. There is no abnormal radiotracer accumulation to suggest bile leak. IMPRESSION: 1. Normal hepatobiliary scan in a patient status post cholecystectomy. No evidence of bile leak. Electronically Signed   By: Michael  Brown M.D.   On: 02/22/2021 21:15    Impression/Plan: Epigastric pain and elevated LFTs 2 weeks after lap chole with suboptimal IOC. I suspect she passed a stone causing her presentation and doubt a peptic ulcer or intrahepatic source. LFTs improving and I do not think an ERCP is needed at this time. Will do an EGD tomorrow morning to rule out peptic ulcer disease due to the intensity of her abdominal pain but I think she most likely passed a retained gallstone. Clear liquid diet this evening after dinner and NPO p MN. EGD tomorrow at 9AM with Dr. Brahmbhatt. If that is unrevealing and LFTs continue to normalize, then she should be able to go home tomorrow afternoon with close outpt f/u of her LFTs.     LOS: 1 day   Taneal Sonntag C Lynsee Wands  02/23/2021, 2:09 PM  Questions please call [phone]  

## 2021-02-24 ENCOUNTER — Inpatient Hospital Stay (HOSPITAL_COMMUNITY): Payer: Managed Care, Other (non HMO) | Admitting: Anesthesiology

## 2021-02-24 ENCOUNTER — Encounter (HOSPITAL_COMMUNITY)
Admission: EM | Disposition: A | Discharge status: Home / Self Care | Payer: Self-pay | Source: Home / Self Care | Attending: Family Medicine

## 2021-02-24 ENCOUNTER — Encounter (HOSPITAL_COMMUNITY): Payer: Self-pay | Admitting: Internal Medicine

## 2021-02-24 DIAGNOSIS — R1013 Epigastric pain: Secondary | ICD-10-CM | POA: Diagnosis not present

## 2021-02-24 DIAGNOSIS — K2210 Ulcer of esophagus without bleeding: Secondary | ICD-10-CM

## 2021-02-24 HISTORY — PX: BIOPSY: SHX5522

## 2021-02-24 HISTORY — PX: ESOPHAGOGASTRODUODENOSCOPY (EGD) WITH PROPOFOL: SHX5813

## 2021-02-24 LAB — COMPREHENSIVE METABOLIC PANEL
ALT: 195 U/L — ABNORMAL HIGH (ref 0–44)
AST: 62 U/L — ABNORMAL HIGH (ref 15–41)
Albumin: 3.6 g/dL (ref 3.5–5.0)
Alkaline Phosphatase: 126 U/L (ref 38–126)
Anion gap: 9 (ref 5–15)
BUN: 13 mg/dL (ref 6–20)
CO2: 23 mmol/L (ref 22–32)
Calcium: 9 mg/dL (ref 8.9–10.3)
Chloride: 109 mmol/L (ref 98–111)
Creatinine, Ser: 0.71 mg/dL (ref 0.44–1.00)
GFR, Estimated: >60 mL/min (ref >60)
Glucose, Bld: 114 mg/dL — ABNORMAL HIGH (ref 70–99)
Potassium: 4 mmol/L (ref 3.5–5.1)
Sodium: 141 mmol/L (ref 135–145)
Total Bilirubin: 0.8 mg/dL (ref 0.3–1.2)
Total Protein: 6.9 g/dL (ref 6.5–8.1)

## 2021-02-24 LAB — CBC
HCT: 39.4 % (ref 36.0–46.0)
Hemoglobin: 12.7 g/dL (ref 12.0–15.0)
MCH: 27.6 pg (ref 26.0–34.0)
MCHC: 32.2 g/dL (ref 30.0–36.0)
MCV: 85.7 fL (ref 80.0–100.0)
Platelets: 232 10*3/uL (ref 150–400)
RBC: 4.6 MIL/uL (ref 3.87–5.11)
RDW: 13.8 % (ref 11.5–15.5)
WBC: 7.9 10*3/uL (ref 4.0–10.5)
nRBC: 0 % (ref 0.0–0.2)

## 2021-02-24 SURGERY — ESOPHAGOGASTRODUODENOSCOPY (EGD) WITH PROPOFOL
Anesthesia: Monitor Anesthesia Care

## 2021-02-24 MED ORDER — LACTATED RINGERS IV SOLN: Freq: Once | INTRAVENOUS | Status: AC

## 2021-02-24 MED ORDER — PROPOFOL 500 MG/50ML IV EMUL
INTRAVENOUS | Status: DC | PRN
  Administered 2021-02-24: 125 ug/kg/min via INTRAVENOUS

## 2021-02-24 MED ORDER — PROPOFOL 500 MG/50ML IV EMUL
INTRAVENOUS | Status: AC
  Filled 2021-02-24: qty 50

## 2021-02-24 MED ORDER — PANTOPRAZOLE SODIUM 40 MG PO TBEC: 40.0000 mg | DELAYED_RELEASE_TABLET | Freq: Two times a day (BID) | ORAL | 0 refills | Status: DC

## 2021-02-24 MED ORDER — PANTOPRAZOLE SODIUM 40 MG IV SOLR: 40.0000 mg | INTRAVENOUS | Status: DC

## 2021-02-24 MED ORDER — PROPOFOL 10 MG/ML IV BOLUS
INTRAVENOUS | Status: DC | PRN
  Administered 2021-02-24: 10 mg via INTRAVENOUS

## 2021-02-24 MED ORDER — LIDOCAINE HCL 1 % IJ SOLN
INTRAMUSCULAR | Status: DC | PRN
  Administered 2021-02-24: 50 mg via INTRADERMAL

## 2021-02-24 MED ORDER — DOCUSATE SODIUM 100 MG PO CAPS
100.0000 mg | ORAL_CAPSULE | Freq: Two times a day (BID) | ORAL | Status: DC
  Administered 2021-02-24: 100 mg via ORAL
  Filled 2021-02-24: qty 1

## 2021-02-24 MED ORDER — DEXMEDETOMIDINE HCL IN NACL 200 MCG/50ML IV SOLN
INTRAVENOUS | Status: DC | PRN
  Administered 2021-02-24 (×2): 4 ug via INTRAVENOUS

## 2021-02-24 MED ORDER — PROPOFOL 1000 MG/100ML IV EMUL
INTRAVENOUS | Status: AC
  Filled 2021-02-24: qty 100

## 2021-02-24 MED ORDER — POLYETHYLENE GLYCOL 3350 17 G PO PACK
17.0000 g | PACK | Freq: Every day | ORAL | Status: DC
  Administered 2021-02-24: 17 g via ORAL
  Filled 2021-02-24: qty 1

## 2021-02-24 SURGICAL SUPPLY — 15 items

## 2021-02-24 NOTE — Discharge Summary (Signed)
Physician Discharge Summary  Jill Bass Jill Bass DOB: 04-04-1986 DOA: 02/20/2021  PCP: Jill Montana, MD  Admit date: 02/20/2021 Discharge date: 02/24/2021  Admitted From: Home Disposition: Home  Recommendations for Outpatient Follow-up:  1. Follow up with PCP in 1 week 2. Please obtain BMP/CBC in one week 3. Please follow up on the following pending results: Esophageal biopsy result  Home Health: None Equipment/Devices: None  Discharge Condition: Stabl CODE STATUS: Full code Diet recommendation: Low fat   Brief/Interim Summary:  Admission HPI written by Eduard Clos, MD  HPI: Jill Bass is a 35 y.o. female with no significant past medical history presents to the ER with complaints of abdominal pain.  Patient had a cholecystectomy on February 03, 2021 about 2 weeks ago and since then has been doing fine.  Yesterday morning patient started having epigastric discomfort which progressively got worse through the day.  Had some nausea no vomiting or diarrhea.  Denies any fever or chills.  Pain is stabbing in nature and decided to come to the ER.   Hospital course:  Abdominal pain CT abdomen/pelvis was negative for loculated fluid. AST/ALT/Bilirubin elevated and trending down. General surgery consulted. MRCP without evidence of stone or ductal dilation. HIDA scan negative. Hepatitis panel negative. Gastroenterology consulted. Likely patient had a retained gallstone that passed prior to imaging.  Esophageal ulcer Patient evaluated by GI. EGD significant for non-bleeding esophageal ulcer. Recommendation for Protonix 40 mg BID. Biopsy obtained and pending on discharge.  Hyponatremia Mild.  Discharge Diagnoses:  Principal Problem:   Intractable epigastric abdominal pain Active Problems:   Abdominal pain   Esophageal ulcer    Discharge Instructions  Discharge Instructions    Increase activity slowly   Complete by: As directed    No wound care    Complete by: As directed      Allergies as of 02/24/2021      Reactions   Amoxicillin Hives   Penicillins Hives   Latex Rash      Medication List    STOP taking these medications   ibuprofen 200 MG tablet Commonly known as: ADVIL   oxyCODONE 5 MG immediate release tablet Commonly known as: Oxy IR/ROXICODONE     TAKE these medications   pantoprazole 40 MG tablet Commonly known as: Protonix Take 1 tablet (40 mg total) by mouth 2 (two) times daily.   simethicone 125 MG chewable tablet Commonly known as: MYLICON Chew 125 mg by mouth every 6 (six) hours as needed. gas       Follow-up Information    Jill Montana, MD. Schedule an appointment as soon as possible for a visit in 1 week(s).   Specialty: Family Medicine Why: Hospital follow-up Contact information: [address] [place] [address] [phone]        Kerin Salen, MD Follow up.   Specialty: Gastroenterology Why: Biopsy results. Contact information: [address] [place] [address] [phone]              Allergies  Allergen Reactions  . Amoxicillin Hives  . Penicillins Hives  . Latex Rash    Consultations:  General surgery  Gastroenterology   Procedures/Studies: DG Cholangiogram Operative  Result Date: 02/03/2021 CLINICAL DATA:  Laparoscopic cholecystectomy with intraoperative cholangiogram EXAM: INTRAOPERATIVE CHOLANGIOGRAM TECHNIQUE: Cholangiographic images from the C-arm fluoroscopic device were submitted for interpretation post-operatively. Please see the procedural report for the amount of contrast and the fluoroscopy time utilized. COMPARISON:  Abdominal ultrasound  02/02/2021 FINDINGS: A single saved image demonstrates cannulation of the cystic duct remanent with intraoperative cholangiogram. No evidence of biliary ductal dilatation, stenosis or stricture. No filling defect to suggest choledocholithiasis. Contrast material has passed through the  ampulla and into the duodenum. Extravasation of contrast material was also present within the subhepatic space. IMPRESSION: No evidence of choledocholithiasis. Electronically Signed   By: Malachy Moan M.D.   On: 02/03/2021 13:46   CT Abdomen Pelvis W Contrast  Result Date: 02/20/2021 CLINICAL DATA:  Epigastric pain after gallbladder removal beginning of April. EXAM: CT ABDOMEN AND PELVIS WITH CONTRAST TECHNIQUE: Multidetector CT imaging of the abdomen and pelvis was performed using the standard protocol following bolus administration of intravenous contrast. CONTRAST:  OMNIPAQUE IOHEXOL 300 MG/ML  SOLN COMPARISON:  02/02/2021 FINDINGS: Lower chest: The lung bases are clear. Hepatobiliary: Surgical absence of the gallbladder. Small amount of fluid and stranding in the gallbladder fossa is likely postoperative. No loculated collection. No bile duct dilatation. No focal liver lesions. Pancreas: Unremarkable. No pancreatic ductal dilatation or surrounding inflammatory changes. Spleen: Normal in size without focal abnormality. Adrenals/Urinary Tract: Adrenal glands are unremarkable. Kidneys are normal, without renal calculi, focal lesion, or hydronephrosis. Bladder is unremarkable. Stomach/Bowel: The stomach, small bowel, and colon are not abnormally distended. Scattered diverticula in the colon. No evidence of acute diverticulitis. Appendix is normal. Vascular/Lymphatic: No significant vascular findings are present. No enlarged abdominal or pelvic lymph nodes. Reproductive: Uterus and ovaries are not enlarged. Involuting cyst in the right ovary. Other: No free air or free fluid in the abdomen. Minimal periumbilical hernia containing fat. Musculoskeletal: No acute or significant osseous findings. IMPRESSION: 1. Surgical absence of the gallbladder. Small amount of fluid and stranding in the gallbladder fossa is likely postoperative. No loculated collection. 2. No evidence of bowel obstruction or  inflammation. 3. Involuting cyst in the right ovary. 4. Minimal periumbilical hernia containing fat. Electronically Signed   By: Burman Nieves M.D.   On: 02/20/2021 21:41   CT Abdomen Pelvis W Contrast  Result Date: 02/02/2021 CLINICAL DATA:  Abdominal pain EXAM: CT ABDOMEN AND PELVIS WITH CONTRAST TECHNIQUE: Multidetector CT imaging of the abdomen and pelvis was performed using the standard protocol following bolus administration of intravenous contrast. CONTRAST:  OMNIPAQUE IOHEXOL 300 MG/ML  SOLN COMPARISON:  October 05, 2011 FINDINGS: Lower chest: Lung bases are clear. Hepatobiliary: No focal liver lesions are appreciable. The gallbladder is mildly distended without gallbladder wall thickening. No appreciable biliary duct dilatation. Pancreas: There is no pancreatic mass or inflammatory focus. Spleen: No splenic lesions are evident. Adrenals/Urinary Tract: Adrenals bilaterally appear normal. There is no renal mass or hydronephrosis on either side. No renal or ureteral calculus on either side. Urinary bladder is midline with wall thickness within normal limits. Stomach/Bowel: Stomach is filled with fluid. Stomach wall is not thickened. There is no appreciable bowel wall or mesenteric thickening on this study. There are occasional sigmoid diverticula without diverticulitis. No evident bowel obstruction. The terminal ileum appears normal. The appendix appears normal. There is no demonstrable free air or portal venous air. Vascular/Lymphatic: No abdominal aortic aneurysm. No arterial vascular lesions are evident. Major venous structures appear patent. No evident adenopathy in the abdomen or pelvis. Reproductive: Uterus is anteverted.  No adnexal masses evident. Other: No ascites or abscess evident in the abdomen or pelvis. There is fat in the umbilicus. Musculoskeletal: No blastic or lytic bone lesions. No evident chest wall lesions. IMPRESSION: 1. Gallbladder mildly distended without gallbladder wall  thickening. 2. Sigmoid diverticula without diverticulitis. No bowel wall thickening or bowel obstruction. No abscess in the abdomen or pelvis. Appendix appears normal. 3. No renal or ureteral calculus. No hydronephrosis. Urinary bladder wall thickness within normal limits. Electronically Signed   By: Bretta Bang III M.D.   On: 02/02/2021 09:10   DG Chest Port 1 View  Result Date: 02/20/2021 CLINICAL DATA:  Epigastric pain.  Cholecystectomy 2 weeks ago. EXAM: PORTABLE CHEST 1 VIEW COMPARISON:  None. FINDINGS: The cardiomediastinal contours are normal. The lungs are clear. Pulmonary vasculature is normal. No consolidation, pleural effusion, or pneumothorax. No acute osseous abnormalities are seen. IMPRESSION: Negative portable view of the chest. Electronically Signed   By: Narda Rutherford M.D.   On: 02/20/2021 20:34   MR ABDOMEN MRCP W WO CONTAST  Result Date: 02/21/2021 CLINICAL DATA:  Jaundice with elevated liver function tests in a 35 year old female post cholecystectomy. EXAM: MRI ABDOMEN WITHOUT AND WITH CONTRAST (INCLUDING MRCP) TECHNIQUE: Multiplanar multisequence MR imaging of the abdomen was performed both before and after the administration of intravenous contrast. Heavily T2-weighted images of the biliary and pancreatic ducts were obtained, and three-dimensional MRCP images were rendered by post processing. CONTRAST:  34mL GADAVIST GADOBUTROL 1 MMOL/ML IV SOLN COMPARISON:  February 20, 2021 CT evaluation. FINDINGS: Lower chest: Limited assessment of the lung bases by MRI. No gross effusion or sign of consolidation. Hepatobiliary: Biliary tree nondilated with changes of cholecystectomy. No filling defect in the common bile duct. Small amount of fluid in the gallbladder fossa and small amount of stranding about the area of postoperative change. Fluid measures 10 x 7 mm and is unchanged compared to the recent CT evaluation. No focal, suspicious hepatic lesion. Pancreas: Normal, without mass,  inflammation or ductal dilatation. Normal intrinsic T1 signal. No signs of pancreatic divisum. Spleen: Spleen with normal size and contour. The no suspicious splenic lesion. Adrenals/Urinary Tract: Adrenal glands are normal. Symmetric renal enhancement. No hydronephrosis. Stomach/Bowel: Gastrointestinal tract unremarkable to the extent evaluated on abdominal MRI with limited assessment. Vascular/Lymphatic: Abdominal aorta is normal caliber. Smooth contour of the IVC. There is no gastrohepatic or hepatoduodenal ligament lymphadenopathy. No retroperitoneal or mesenteric lymphadenopathy. Other: Trace perihepatic ascites. Trace fluid may be present along the RIGHT pericolic gutter. Musculoskeletal: No suspicious bone lesions identified. IMPRESSION: 1. Status post cholecystectomy. No biliary ductal dilatation or choledocholithiasis. 2. Small amount of fluid in the gallbladder fossa and small amount of stranding about the area of postoperative change. This is unchanged compared to the recent CT evaluation. 3. Trace perihepatic ascites. Trace fluid may also be present along the RIGHT pericolic gutter. Findings are nonspecific. If there is clinical concern for biliary leak nuclear medicine study may be helpful. Volume of fluid is very small in remains nonspecific. Electronically Signed   By: Donzetta Kohut M.D.   On: 02/21/2021 15:24   NM HEPATOBILIARY LEAK (POST-SURGICAL)  Result Date: 02/22/2021 CLINICAL DATA:  Upper abdominal pain, nausea and vomiting for 2 days, cholecystectomy 02/03/2021 EXAM: NUCLEAR MEDICINE HEPATOBILIARY IMAGING TECHNIQUE: Sequential images of the abdomen were obtained out to 60 minutes following intravenous administration of radiopharmaceutical. RADIOPHARMACEUTICALS:  5.5 mCi Tc-1m  Choletec IV COMPARISON:  02/20/2021, 02/21/2021 FINDINGS: There is prompt uptake of radiotracer by the hepatic parenchyma. Anterior imaging was performed over 2 hours. Radiotracer is seen outlining the common bile  duct and small bowel. There is no abnormal radiotracer accumulation to suggest bile leak. IMPRESSION: 1. Normal hepatobiliary scan in a patient status post cholecystectomy. No evidence of bile  leak. Electronically Signed   By: Sharlet Salina M.D.   On: 02/22/2021 21:15   US Abdomen Limited RUQ (LIVER/GB)  Result Date: 02/02/2021 CLINICAL DATA:  Upper abdominal pain EXAM: ULTRASOUND ABDOMEN LIMITED RIGHT UPPER QUADRANT COMPARISON:  CT abdomen and pelvis February 02, 2021 FINDINGS: Gallbladder: Within the gallbladder, there are scattered echogenic foci which move and shadow consistent with cholelithiasis. Largest gallstone measures 7 mm in length. There is also sludge in the gallbladder. There is no appreciable gallbladder wall thickening or pericholecystic fluid. No sonographic Murphy sign noted by sonographer. Common bile duct: Diameter: 2 mm. No intrahepatic or extrahepatic biliary duct dilatation. Liver: No focal lesion identified. Within normal limits in parenchymal echogenicity. Portal vein is patent on color Doppler imaging with normal direction of blood flow towards the liver. Other: None. IMPRESSION: Cholelithiasis and mild sludge in gallbladder. No gallbladder wall thickening or pericholecystic fluid. Study otherwise unremarkable. Electronically Signed   By: Bretta Bang III M.D.   On: 02/02/2021 11:23     Impression:               - Normal upper third of esophagus and middle third                            of esophagus.                           - Esophageal ulcer with no bleeding and no stigmata                            of recent bleeding. Biopsied.                           - Erythematous mucosa in the gastric body and                            antrum. Biopsied.                           - Normal examined duodenum.  Recommendation:           - Resume regular diet.                           - Continue present medications.                           - Await pathology results.                            - Use Protonix (pantoprazole) 40 mg PO daily for 1                            month.                           - Aggressive anti reflux measures such as weight                            loss, elevate head end of the bed during sleep,  avoid or limit caffeinated products and space last                            meal of the day and bedtime by at least 3 hours.   Subjective: Abdominal pain is improved. Some fullness.  Discharge Exam: Vitals:   02/24/21 0929 02/24/21 0950  BP: (!) 151/99 (!) 147/100  Pulse: 77 80  Resp: 15 16  Temp:  97.7 F (36.5 C)  SpO2: 96% 94%   Vitals:   02/24/21 0915 02/24/21 0920 02/24/21 0929 02/24/21 0950  BP: (!) 133/101 (!) 148/108 (!) 151/99 (!) 147/100  Pulse: 80 76 77 80  Resp: 18 16 15 16   Temp: 97.7 F (36.5 C)   97.7 F (36.5 C)  TempSrc: Oral   Oral  SpO2: 100% 97% 96% 94%  Weight:      Height:        General: Pt is alert, awake, not in acute distress Cardiovascular: RRR, S1/S2 +, no rubs, no gallops Respiratory: CTA bilaterally, no wheezing, no rhonchi Abdominal: Soft, NT, ND, bowel sounds + Extremities: no edema, no cyanosis    The results of significant diagnostics from this hospitalization (including imaging, microbiology, ancillary and laboratory) are listed below for reference.     Microbiology: Recent Results (from the past 240 hour(s))  Culture, blood (Routine X 2) w Reflex to ID Panel     Status: None (Preliminary result)   Collection Time: 02/20/21  8:11 PM   Specimen: BLOOD  Result Value Ref Range Status   Specimen Description   Final    BLOOD RIGHT ANTECUBITAL Performed at [facility], [address] [place], [place]    Special Requests   Final    BOTTLES DRAWN AEROBIC AND ANAEROBIC Blood Culture adequate volume Performed at [facility], [address], [place], [place]    Culture   Final    NO GROWTH 4 DAYS Performed at  [facility], [address], [place], [place]    Report Status PENDING  Incomplete  Culture, blood (Routine X 2) w Reflex to ID Panel     Status: None (Preliminary result)   Collection Time: 02/20/21  8:35 PM   Specimen: BLOOD  Result Value Ref Range Status   Specimen Description   Final    BLOOD LEFT ANTECUBITAL Performed at [facility], [address] [place], [place]    Special Requests   Final    BOTTLES DRAWN AEROBIC AND ANAEROBIC Blood Culture adequate volume Performed at [facility], [address] [place], [place]    Culture   Final    NO GROWTH 4 DAYS Performed at [facility], [address], [place], [place]    Report Status PENDING  Incomplete  Resp Panel by RT-PCR (Flu A&B, Covid)     Status: None   Collection Time: 02/21/21 12:02 AM   Specimen: Nasopharyngeal(NP) swabs in vial transport medium  Result Value Ref Range Status   SARS Coronavirus 2 by RT PCR NEGATIVE NEGATIVE Final    Comment: (NOTE) SARS-CoV-2 target nucleic acids are NOT DETECTED.  The SARS-CoV-2 RNA is generally detectable in upper respiratory specimens during the acute phase of infection. The lowest concentration of SARS-CoV-2 viral copies this assay can detect is 138 copies/mL. A negative result does not preclude SARS-Cov-2 infection and  should not be used as the sole basis for treatment or other patient management decisions. A negative result may occur with  improper specimen collection/handling, submission of specimen other than nasopharyngeal swab, presence of viral mutation(s) within the areas targeted by this assay, and inadequate number of viral copies(<138 copies/mL). A negative result must be combined with clinical observations, patient history, and epidemiological information. The expected result is Negative.  Fact Sheet for Patients:  BloggerCourse.com  Fact Sheet for Healthcare  Providers:  SeriousBroker.it  This test is no t yet approved or cleared by the [place] FDA and  has been authorized for detection and/or diagnosis of SARS-CoV-2 by FDA under an Emergency Use Authorization (EUA). This EUA will remain  in effect (meaning this test can be used) for the duration of the COVID-19 declaration under Section 564(b)(1) of the Act, 21 U.S.C.section 360bbb-3(b)(1), unless the authorization is terminated  or revoked sooner.       Influenza A by PCR NEGATIVE NEGATIVE Final   Influenza B by PCR NEGATIVE NEGATIVE Final    Comment: (NOTE) The Xpert Xpress SARS-CoV-2/FLU/RSV plus assay is intended as an aid in the diagnosis of influenza from Nasopharyngeal swab specimens and should not be used as a sole basis for treatment. Nasal washings and aspirates are unacceptable for Xpert Xpress SARS-CoV-2/FLU/RSV testing.  Fact Sheet for Patients: BloggerCourse.com  Fact Sheet for Healthcare Providers: SeriousBroker.it  This test is not yet approved or cleared by the [place] FDA and has been authorized for detection and/or diagnosis of SARS-CoV-2 by FDA under an Emergency Use Authorization (EUA). This EUA will remain in effect (meaning this test can be used) for the duration of the COVID-19 declaration under Section 564(b)(1) of the Act, 21 U.S.C. section 360bbb-3(b)(1), unless the authorization is terminated or revoked.  Performed at [facility], [address] [place], [place] [address]      Labs: BNP (last 3 results) No results for input(s): BNP in the last 8760 hours. Basic Metabolic Panel: Recent Labs  Lab 02/20/21 2011 02/21/21 0610 02/22/21 0459 02/23/21 0600 02/24/21 0541  NA 134* 136 143 134* 141  K 3.5 4.3 3.9 3.6 4.0  CL 101 105 112* 102 109  CO2 22 21* 27 21* 23  GLUCOSE 116* 140* 119* 73 114*  BUN CREATININE 0.75 0.70 0.75 0.70  0.71  CALCIUM 9.3 8.6* 8.9 8.8* 9.0  MG 1.9  --   --  1.9  --    Liver Function Tests: Recent Labs  Lab 02/20/21 2011 02/21/21 0610 02/22/21 0459 02/23/21 0600 02/24/21 0541  AST 56* 376* 339* 173* 62*  ALT 38 253* 371* 312* 195*  ALKPHOS 65 103 129* 148* 126  BILITOT 1.2 1.4* 2.3* 1.6* 0.8  PROT 7.4 7.1 6.5 7.2 6.9  ALBUMIN 3.9 3.8 3.6 3.8 3.6   Recent Labs  Lab 02/20/21 2011  LIPASE 37   No results for input(s): AMMONIA in the last 168 hours. CBC: Recent Labs  Lab 02/20/21 2011 02/21/21 0610 02/22/21 0459 02/23/21 0600 02/24/21 0541  WBC 10.8* 8.7 6.6 7.4 7.9  NEUTROABS 6.2 7.3  --   --   --   HGB 13.1 12.1 12.0 12.9 12.7  HCT 38.5 37.8 37.3 40.2 39.4  MCV 83.3 87.1 87.1 86.6 85.7  PLT 270 243 230 235 232   Urinalysis    Component Value Date/Time   COLORURINE YELLOW 02/02/2021 0730   APPEARANCEUR HAZY (A) 02/02/2021 0730  LABSPEC 1.020 02/02/2021 0730   PHURINE 6.5 02/02/2021 0730   GLUCOSEU NEGATIVE 02/02/2021 0730   HGBUR MODERATE (A) 02/02/2021 0730   BILIRUBINUR NEGATIVE 02/02/2021 0730   KETONESUR NEGATIVE 02/02/2021 0730   PROTEINUR NEGATIVE 02/02/2021 0730   UROBILINOGEN 0.2 10/05/2011 1345   NITRITE NEGATIVE 02/02/2021 0730   LEUKOCYTESUR NEGATIVE 02/02/2021 0730   Microbiology Recent Results (from the past 240 hour(s))  Culture, blood (Routine X 2) w Reflex to ID Panel     Status: None (Preliminary result)   Collection Time: 02/20/21  8:11 PM   Specimen: BLOOD  Result Value Ref Range Status   Specimen Description   Final    BLOOD RIGHT ANTECUBITAL Performed at [facility], [address] [place], [place] [address]    Special Requests   Final    BOTTLES DRAWN AEROBIC AND ANAEROBIC Blood Culture adequate volume Performed at [facility], [address] [place], [place] [address]    Culture   Final    NO GROWTH 4 DAYS Performed at [facility], [address] [place], [place] [address]    Report Status  PENDING  Incomplete  Culture, blood (Routine X 2) w Reflex to ID Panel     Status: None (Preliminary result)   Collection Time: 02/20/21  8:35 PM   Specimen: BLOOD  Result Value Ref Range Status   Specimen Description   Final    BLOOD LEFT ANTECUBITAL Performed at [facility], [address] [place], [place] [address]    Special Requests   Final    BOTTLES DRAWN AEROBIC AND ANAEROBIC Blood Culture adequate volume Performed at [facility], [address] [place], [place] [address]    Culture   Final    NO GROWTH 4 DAYS Performed at [facility], [address] [place], [place] [address]    Report Status PENDING  Incomplete  Resp Panel by RT-PCR (Flu A&B, Covid)     Status: None   Collection Time: 02/21/21 12:02 AM   Specimen: Nasopharyngeal(NP) swabs in vial transport medium  Result Value Ref Range Status   SARS Coronavirus 2 by RT PCR NEGATIVE NEGATIVE Final    Comment: (NOTE) SARS-CoV-2 target nucleic acids are NOT DETECTED.  The SARS-CoV-2 RNA is generally detectable in upper respiratory specimens during the acute phase of infection. The lowest concentration of SARS-CoV-2 viral copies this assay can detect is 138 copies/mL. A negative result does not preclude SARS-Cov-2 infection and should not be used as the sole basis for treatment or other patient management decisions. A negative result may occur with  improper specimen collection/handling, submission of specimen other than nasopharyngeal swab, presence of viral mutation(s) within the areas targeted by this assay, and inadequate number of viral copies(<138 copies/mL). A negative result must be combined with clinical observations, patient history, and epidemiological information. The expected result is Negative.  Fact Sheet for Patients:  BloggerCourse.com  Fact Sheet for Healthcare Providers:  SeriousBroker.it  This test is no t yet  approved or cleared by the [place] FDA and  has been authorized for detection and/or diagnosis of SARS-CoV-2 by FDA under an Emergency Use Authorization (EUA). This EUA will remain  in effect (meaning this test can be used) for the duration of the COVID-19 declaration under Section 564(b)(1) of the Act, 21 U.S.C.section 360bbb-3(b)(1), unless the authorization is terminated  or revoked sooner.       Influenza A  by PCR NEGATIVE NEGATIVE Final   Influenza B by PCR NEGATIVE NEGATIVE Final    Comment: (NOTE) The Xpert Xpress SARS-CoV-2/FLU/RSV plus assay is intended as an aid in the diagnosis of influenza from Nasopharyngeal swab specimens and should not be used as a sole basis for treatment. Nasal washings and aspirates are unacceptable for Xpert Xpress SARS-CoV-2/FLU/RSV testing.  Fact Sheet for Patients: BloggerCourse.comhttps://www.fda.gov/media/152166/download  Fact Sheet for Healthcare Providers: [email or web address].ithttps://www.fda.gov/media/152162/download  This test is not yet approved or cleared by the [place] FDA and has been authorized for detection and/or diagnosis of SARS-CoV-2 by FDA under an Emergency Use Authorization (EUA). This EUA will remain in effect (meaning this test can be used) for the duration of the COVID-19 declaration under Section 564(b)(1) of the Act, 21 U.S.C. section 360bbb-3(b)(1), unless the authorization is terminated or revoked.  Performed at [facility], [address] [place], [place] [address]      Time coordinating discharge: 35 minutes  SIGNED:   Jacquelin Hawkingalph Rozlynn Lippold, MD [facility] 02/24/2021, 11:50 AM

## 2021-02-24 NOTE — Op Note (Signed)
[facility] Patient Name: Jill Bass Procedure Date: 02/24/2021 MRN: [id number] Attending MD: Kerin Salen , MD Date of Birth: 1986-07-16 CSN: [id number] Age: 35 Admit Type: Inpatient Procedure:                Upper GI endoscopy Indications:              Epigastric abdominal pain Providers:                Kerin Salen, MD, Glory Rosebush, RN, Beryle Beams,                            Technician, Marlena Clipper, CRNA Referring MD:             [facility], Laurann Montana, MD Medicines:                Monitored Anesthesia Care, Propofol per Anesthesia Complications:            No immediate complications. Estimated blood loss:                            Minimal. Estimated Blood Loss:     Estimated blood loss was minimal. Procedure:                Pre-Anesthesia Assessment:                           - Prior to the procedure, a History and Physical                            was performed, and patient medications and                            allergies were reviewed. The patient's tolerance of                            previous anesthesia was also reviewed. The risks                            and benefits of the procedure and the sedation                            options and risks were discussed with the patient.                            All questions were answered, and informed consent                            was obtained. Prior Anticoagulants: The patient has                            taken no previous anticoagulant or antiplatelet                            agents. ASA Grade Assessment: III - A patient with  severe systemic disease. After reviewing the risks                            and benefits, the patient was deemed in                            satisfactory condition to undergo the procedure.                           After obtaining informed consent, the endoscope was                            passed under direct vision.  Throughout the                            procedure, the patient's blood pressure, pulse, and                            oxygen saturations were monitored continuously. The                            Endoscope was introduced through the mouth, and                            advanced to the second part of duodenum. The upper                            GI endoscopy was accomplished without difficulty.                            The patient tolerated the procedure well. Scope In: Scope Out: Findings:      The upper third of the esophagus and middle third of the esophagus were       normal.      One linear esophageal ulcer with no bleeding and no stigmata of recent       bleeding was found 35 cm from the incisors. The lesion was 6 mm in       largest dimension. Biopsies were taken with a cold forceps for histology.      Mildly erythematous mucosa without bleeding was found in the gastric       body and in the gastric antrum. Biopsies were taken with a cold forceps       for Helicobacter pylori testing.      The cardia and gastric fundus were normal on retroflexion.      The examined duodenum was normal. Impression:               - Normal upper third of esophagus and middle third                            of esophagus.                           - Esophageal ulcer with no bleeding and no stigmata  of recent bleeding. Biopsied.                           - Erythematous mucosa in the gastric body and                            antrum. Biopsied.                           - Normal examined duodenum. Moderate Sedation:      Patient did not receive moderate sedation for this procedure, but       instead received monitored anesthesia care. Recommendation:           - Resume regular diet.                           - Continue present medications.                           - Await pathology results.                           - Use Protonix (pantoprazole) 40 mg PO daily for  1                            month.                           - Aggressive anti reflux measures such as weight                            loss, elevate head end of the bed during sleep,                            avoid or limit caffeinated products and space last                            meal of the day and bedtime by at least 3 hours. Procedure Code(s):        --- Professional ---                           [phone], Esophagogastroduodenoscopy, flexible,                            transoral; with biopsy, single or multiple Diagnosis Code(s):        --- Professional ---                           K22.10, Ulcer of esophagus without bleeding                           K31.89, Other diseases of stomach and duodenum                           R10.13, Epigastric pain CPT copyright 2019 American Medical Association. All rights reserved. The codes documented  in this report are preliminary and upon coder review may  be revised to meet current compliance requirements. Kerin Salen, MD 02/24/2021 9:08:40 AM This report has been signed electronically. Number of Addenda: 0

## 2021-02-24 NOTE — Discharge Instructions (Addendum)
Jill Bass,  You were in the hospital with abdominal pain and likely had a passed gallstone. You were also found to have an esophageal ulcer. Please follow-up with your PCP and Dr. Marca Ancona.

## 2021-02-24 NOTE — Brief Op Note (Signed)
02/20/2021 - 02/24/2021  9:08 AM  PATIENT:  Jill Bass  35 y.o. female  PRE-OPERATIVE DIAGNOSIS:  Epigastric pain  POST-OPERATIVE DIAGNOSIS:  esophageal ulcer, gastritis  PROCEDURE:  Procedure(s): ESOPHAGOGASTRODUODENOSCOPY (EGD) WITH PROPOFOL (N/A) BIOPSY  SURGEON:  Surgeon(s) and Role:    Ronnette Juniper, MD - Primary  PHYSICIAN ASSISTANT:   ASSISTANTS: Julaine Fusi, Danne Baxter   ANESTHESIA:   MAC  EBL:  Minimal  BLOOD ADMINISTERED:none  DRAINS: none   LOCAL MEDICATIONS USED:  NONE  SPECIMEN:  Biopsy / Limited Resection  DISPOSITION OF SPECIMEN:  PATHOLOGY  COUNTS:  YES  TOURNIQUET:  * No tourniquets in log *  DICTATION: .Dragon Dictation  PLAN OF CARE: Admit to inpatient   PATIENT DISPOSITION:  PACU - hemodynamically stable.   Delay start of Pharmacological VTE agent (>24hrs) due to surgical blood loss or risk of bleeding: not applicable

## 2021-02-24 NOTE — Transfer of Care (Signed)
Immediate Anesthesia Transfer of Care Note  Patient: Jill Bass  Procedure(s) Performed: ESOPHAGOGASTRODUODENOSCOPY (EGD) WITH PROPOFOL (N/A ) BIOPSY  Patient Location: PACU and Endoscopy Unit  Anesthesia Type:MAC  Level of Consciousness: awake, alert , oriented and patient cooperative  Airway & Oxygen Therapy: Patient Spontanous Breathing and Patient connected to face mask oxygen  Post-op Assessment: Report given to RN and Post -op Vital signs reviewed and stable  Post vital signs: Reviewed and stable  Last Vitals:  Vitals Value Taken Time  BP    Temp    Pulse 83 02/24/21 0913  Resp 17 02/24/21 0913  SpO2 100 % 02/24/21 0913  Vitals shown include unvalidated device data.  Last Pain:  Vitals:   02/24/21 0818  TempSrc: Oral  PainSc: 0-No pain         Complications: No complications documented.

## 2021-02-24 NOTE — Anesthesia Postprocedure Evaluation (Signed)
Anesthesia Post Note  Patient: Jill Bass  Procedure(s) Performed: ESOPHAGOGASTRODUODENOSCOPY (EGD) WITH PROPOFOL (N/A ) BIOPSY     Patient location during evaluation: PACU Anesthesia Type: MAC Level of consciousness: awake and alert Pain management: pain level controlled Vital Signs Assessment: post-procedure vital signs reviewed and stable Respiratory status: spontaneous breathing and respiratory function stable Cardiovascular status: stable Postop Assessment: no apparent nausea or vomiting Anesthetic complications: no   No complications documented.  Last Vitals:  Vitals:   02/24/21 0929 02/24/21 0950  BP: (!) 151/99 (!) 147/100  Pulse: 77 80  Resp: 15 16  Temp:  36.5 C  SpO2: 96% 94%    Last Pain:  Vitals:   02/24/21 0950  TempSrc: Oral  PainSc:                  Samvel Zinn DANIEL

## 2021-02-24 NOTE — Progress Notes (Signed)
Day of Surgery  Subjective: CC: Patient reports that yesterday she began having some burping/belching and reflux.  This was better after Maalox.  Was tolerating a solid diet yesterday but felt generalized "gas" pain and pressure yesterday.  Noted a "knot" in her epigastrium last night that is resolved this morning.  No nausea or vomiting.  Passing flatus x 2 yesterday and had a small amount of mucus that she passed.  GI planning EGD today. She is supposed to see [place] in the office tomorrow for follow up - will reschedule this.   Objective: Vital signs in last 24 hours: Temp:  [97.7 F (36.5 C)-98.2 F (36.8 C)] 97.7 F (36.5 C) (05/02 0705) Pulse Rate:  [79-85] 85 (05/02 0705) Resp:  [16-17] 17 (05/02 0705) BP: (128-145)/(79-95) 144/81 (05/02 0705) SpO2:  [96 %-100 %] 96 % (05/02 0705) Last BM Date: 02/20/21  Intake/Output from previous day: No intake/output data recorded. Intake/Output this shift: No intake/output data recorded.  PE: Gen:  Alert, NAD, pleasant HEENT: EOM's intact, pupils equal and round Card:  Reg  Pulm:  Normal rate and effort  Abd: Soft, ND, very mild epigastric tenderness without peritonitis. Otherwise NT. Unable to palpate periumbilical hernia noted on CT scan. No other hernia's palpated. No organomegaly or masses.  Psych: A&Ox3  Skin: no rashes noted, warm and dry   Lab Results:  Recent Labs    02/23/21 0600 02/24/21 0541  WBC 7.4 7.9  HGB 12.9 12.7  HCT 40.2 39.4  PLT 235 232   BMET Recent Labs    02/23/21 0600 02/24/21 0541  NA 134* 141  K 3.6 4.0  CL 102 109  CO2 21* 23  GLUCOSE 73 114*  BUN 10 13  CREATININE 0.70 0.71  CALCIUM 8.8* 9.0   PT/INR No results for input(s): LABPROT, INR in the last 72 hours. CMP     Component Value Date/Time   NA 141 02/24/2021 0541   K 4.0 02/24/2021 0541   CL 109 02/24/2021 0541   CO2 23 02/24/2021 0541   GLUCOSE 114 (H) 02/24/2021 0541   BUN 13 02/24/2021 0541   CREATININE 0.71 02/24/2021  0541   CALCIUM 9.0 02/24/2021 0541   PROT 6.9 02/24/2021 0541   ALBUMIN 3.6 02/24/2021 0541   AST 62 (H) 02/24/2021 0541   ALT 195 (H) 02/24/2021 0541   ALKPHOS 126 02/24/2021 0541   BILITOT 0.8 02/24/2021 0541   GFRNONAA >60 02/24/2021 0541   GFRAA >90 10/05/2011 1047   Lipase     Component Value Date/Time   LIPASE 37 02/20/2021 2011       Studies/Results: NM HEPATOBILIARY LEAK (POST-SURGICAL)  Result Date: 02/22/2021 CLINICAL DATA:  Upper abdominal pain, nausea and vomiting for 2 days, cholecystectomy 02/03/2021 EXAM: NUCLEAR MEDICINE HEPATOBILIARY IMAGING TECHNIQUE: Sequential images of the abdomen were obtained out to 60 minutes following intravenous administration of radiopharmaceutical. RADIOPHARMACEUTICALS:  5.5 mCi Tc-92m Choletec IV COMPARISON:  02/20/2021, 02/21/2021 FINDINGS: There is prompt uptake of radiotracer by the hepatic parenchyma. Anterior imaging was performed over 2 hours. Radiotracer is seen outlining the common bile duct and small bowel. There is no abnormal radiotracer accumulation to suggest bile leak. IMPRESSION: 1. Normal hepatobiliary scan in a patient status post cholecystectomy. No evidence of bile leak. Electronically Signed   By: MRanda NgoM.D.   On: 02/22/2021 21:15    Anti-infectives: Anti-infectives (From admission, onward)   None       Assessment/Plan S/p Lap chole 4/11 ([place]) with  negative intraoperative cholangiogram. -  Presented 4/29 with RUQ pain/ epigastric pain that felt like a gb attack with elevated liver enzymes, no leukocytosis or fever - CT 4/29 with small fluid/stranding in GB fossa, involuting R ovarian cyst. This also showed a minimal periumbilical hernia containing fat - MRCP 4/29- no biliary ductal dilatation or choledocholithiasis. Stable fluid in gb fossa and trace perihepatic ascites/ possible trace fluid along right colic gutter - HIDA 1/74- negative for bile leak or obstruction - Acute hepatitis panel 4/30-  negative - LFT trending down today with Bilirubin 0.8 (1.6 yesterday, 1.2 on presentation), AST/ALT downtrending (62/195 from 173/312), Alk phos down at 126 from 148 - Appreciate GI assistance.  They believe she may have passed a stone but are planning EGD for further evaluation.  She was started on Protonix yesterday note some relief with Maalox. - She is supposed to see [place] in the office tomorrow for follow up - will reschedule this.  - She is okay for a diet and bowel regimen after EGD from our standpoint   LOS: 2 days    Jillyn Ledger , [facility] 02/24/2021, 7:57 AM Please see Amion for pager number during day hours 7:00am-4:30pm

## 2021-02-24 NOTE — Interval H&P Note (Signed)
History and Physical Interval Note: 34/female with epigastric pain post cholecystectomy, with elevated LFTs? Passed CBD stone, unremarkable MRCP for an EGD today with propofol to evaluate for PUD.  02/24/2021 8:35 AM  Jill Bass  has presented today for EGD, with the diagnosis of Epigastric pain.  The various methods of treatment have been discussed with the patient and family. After consideration of risks, benefits and other options for treatment, the patient has consented to  Procedure(s): ESOPHAGOGASTRODUODENOSCOPY (EGD) WITH PROPOFOL (N/A) as a surgical intervention.  The patient's history has been reviewed, patient examined, no change in status, stable for surgery.  I have reviewed the patient's chart and labs.  Questions were answered to the patient's satisfaction.     Kerin Salen

## 2021-02-25 ENCOUNTER — Encounter (HOSPITAL_COMMUNITY): Payer: Self-pay | Admitting: Gastroenterology

## 2021-02-25 LAB — CULTURE, BLOOD (ROUTINE X 2)
Culture: NO GROWTH
Culture: NO GROWTH
Special Requests: ADEQUATE
Special Requests: ADEQUATE

## 2021-02-26 LAB — SURGICAL PATHOLOGY

## 2021-05-09 ENCOUNTER — Other Ambulatory Visit (HOSPITAL_COMMUNITY)
Admission: RE | Admit: 2021-05-09 | Discharge: 2021-05-09 | Disposition: A | Discharge status: Home / Self Care | Payer: Managed Care, Other (non HMO) | Source: Ambulatory Visit | Attending: Physician Assistant | Admitting: Physician Assistant

## 2021-05-09 ENCOUNTER — Other Ambulatory Visit: Payer: Self-pay | Admitting: Physician Assistant

## 2021-05-09 DIAGNOSIS — Z124 Encounter for screening for malignant neoplasm of cervix: Secondary | ICD-10-CM | POA: Diagnosis not present

## 2021-05-15 LAB — CYTOLOGY - PAP
Comment: NEGATIVE
Diagnosis: UNDETERMINED — AB
High risk HPV: NEGATIVE

## 2021-05-19 ENCOUNTER — Other Ambulatory Visit: Payer: Self-pay | Admitting: Physician Assistant

## 2021-05-19 DIAGNOSIS — R599 Enlarged lymph nodes, unspecified: Secondary | ICD-10-CM

## 2021-05-21 ENCOUNTER — Ambulatory Visit
Admission: RE | Admit: 2021-05-21 | Discharge: 2021-05-21 | Disposition: A | Discharge status: Home / Self Care | Payer: Managed Care, Other (non HMO) | Source: Ambulatory Visit | Attending: Physician Assistant | Admitting: Physician Assistant

## 2021-05-21 DIAGNOSIS — R599 Enlarged lymph nodes, unspecified: Secondary | ICD-10-CM

## 2021-05-21 IMAGING — US US SOFT TISSUE HEAD/NECK
1 series · 8 of 8 positions shown · non-contrast
Comparison: None.

CLINICAL DATA: Submental lymphadenopathy

EXAM:
ULTRASOUND OF HEAD/NECK SOFT TISSUES
TECHNIQUE: Ultrasound examination of the head and neck soft tissues was
performed in the area of clinical concern.

[Series 1: us soft tissue head/neck · 0.05mm/px · 8 of 8 slices shown]
[im 1/8]
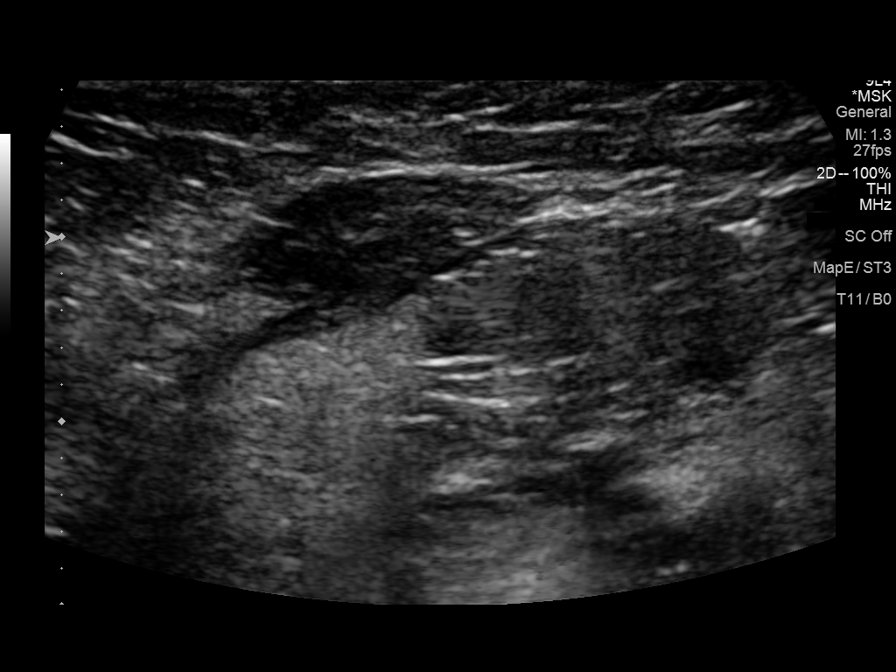
[im 2/8]
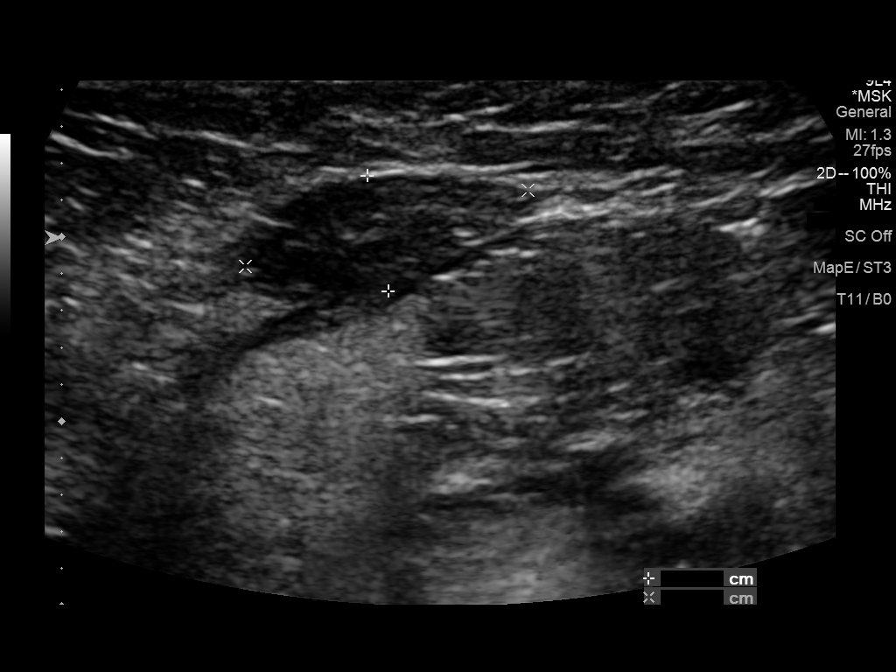
[im 3/8]
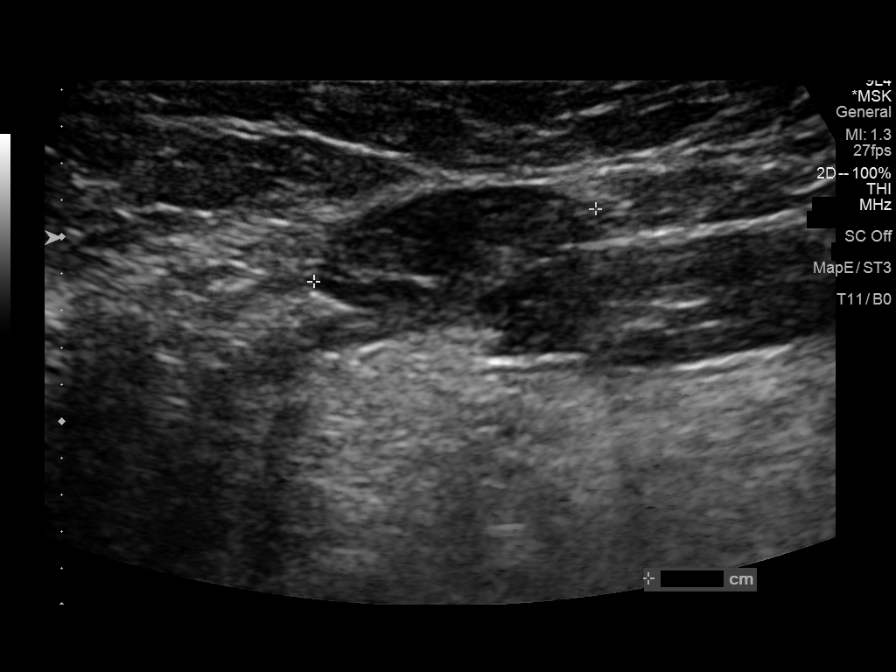
[im 4/8]
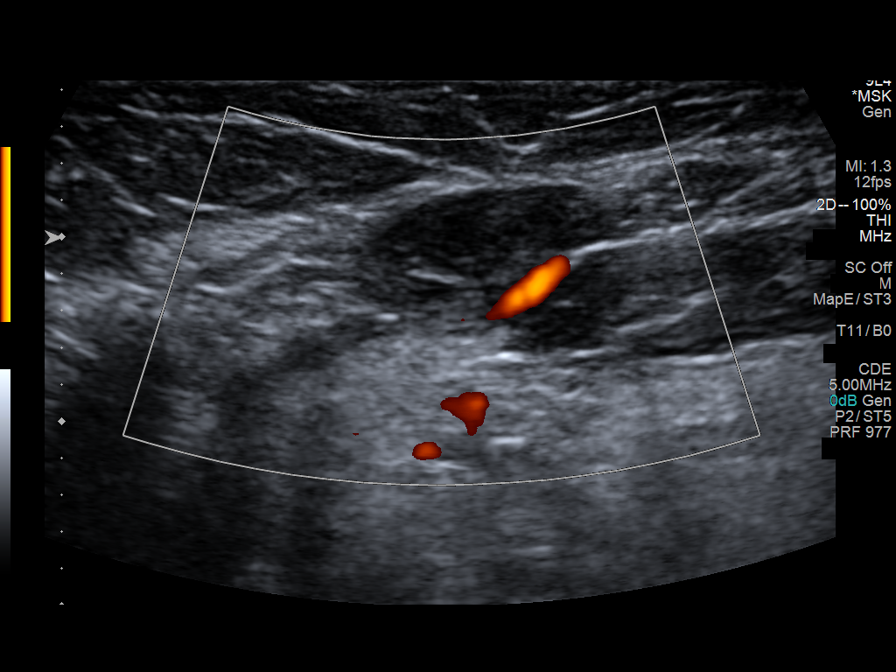
[im 5/8]
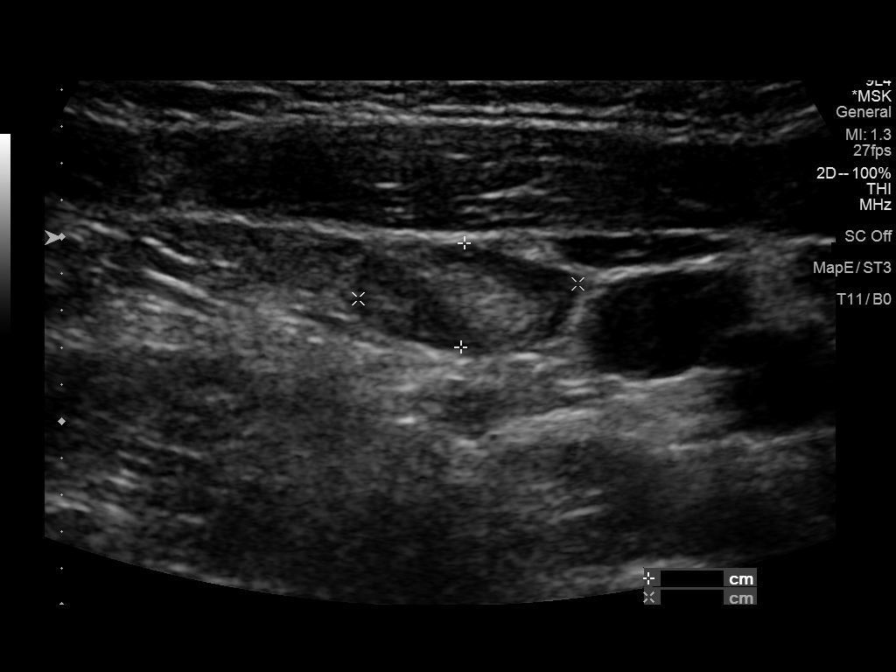
[im 6/8]
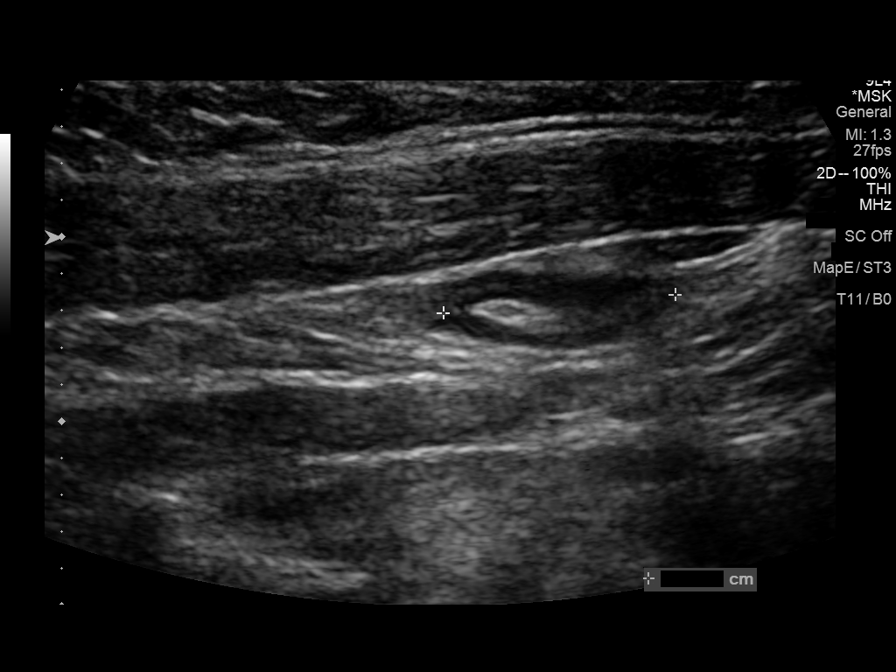
[im 7/8]
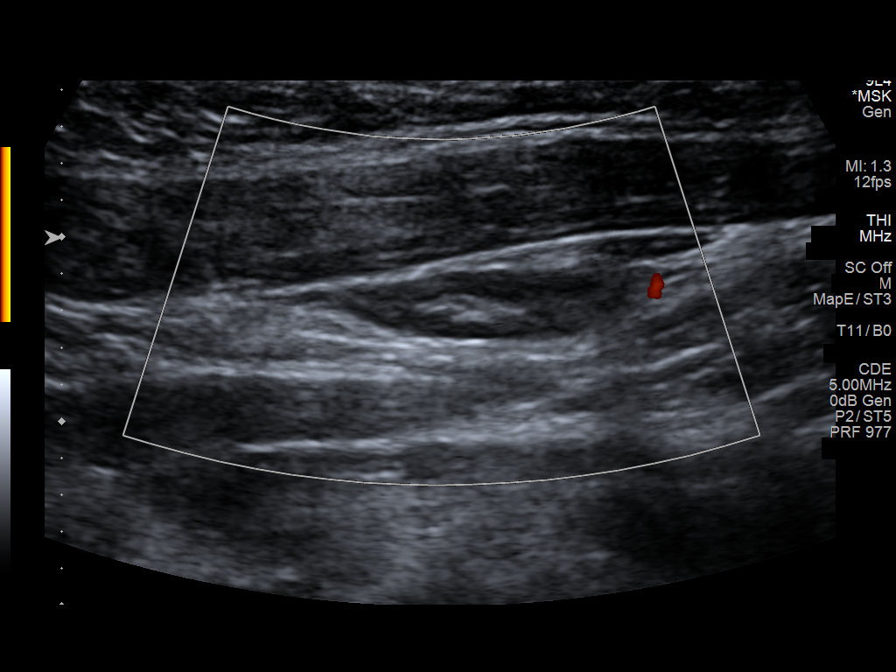
[im 8/8]
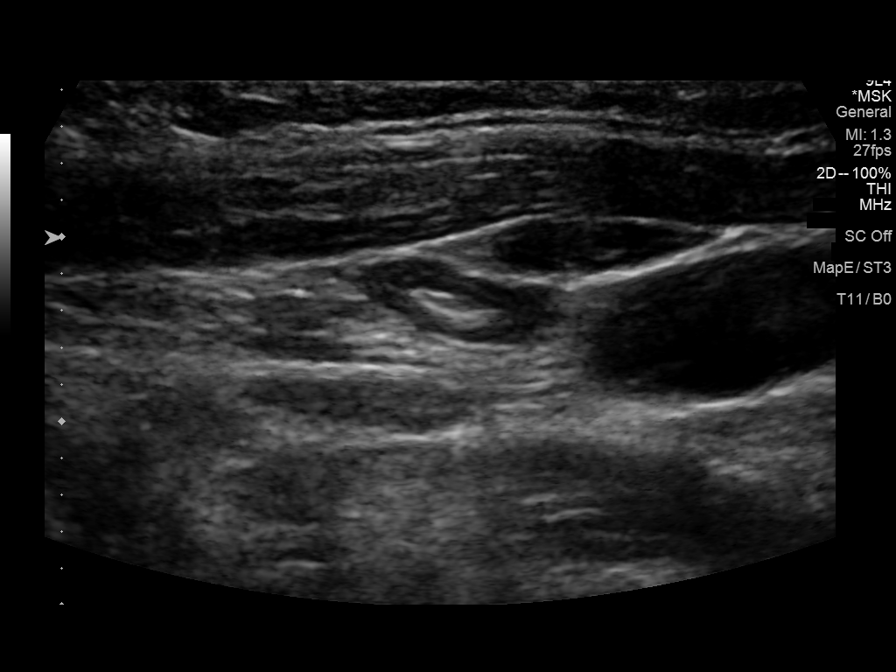

[8 of 8 positions shown; findings below may reference images not displayed]

FINDINGS: Sonographic interrogation of the region of clinical concern
demonstrates an ovoid soft tissue nodule with echogenic fatty hilum
measuring 1.6 x 0.6 x 1.6 cm. 0.6 cm in short axis is not considered
enlarged by imaging criteria. There is a similar appearing lymph
node as well along the right cervical chain measuring 1.2 x 0.6 x
1.3 cm.
IMPRESSION: Mildly prominent, but not enlarged by imaging criteria or
morphologically abnormal lymph nodes in the submental and right
carotid spaces. These are almost certainly reactive in nature.

Recommend continued clinical surveillance. If there is evidence of
enlargement over time, repeat imaging may become warranted.

## 2022-01-21 ENCOUNTER — Emergency Department (HOSPITAL_BASED_OUTPATIENT_CLINIC_OR_DEPARTMENT_OTHER): Payer: Managed Care, Other (non HMO)

## 2022-01-21 ENCOUNTER — Encounter (HOSPITAL_BASED_OUTPATIENT_CLINIC_OR_DEPARTMENT_OTHER): Payer: Self-pay | Admitting: Emergency Medicine

## 2022-01-21 ENCOUNTER — Emergency Department (HOSPITAL_BASED_OUTPATIENT_CLINIC_OR_DEPARTMENT_OTHER)
Admission: EM | Admit: 2022-01-21 | Discharge: 2022-01-21 | Disposition: A | Discharge status: Home / Self Care | Payer: Managed Care, Other (non HMO) | Attending: Emergency Medicine | Admitting: Emergency Medicine

## 2022-01-21 DIAGNOSIS — Y900 Blood alcohol level of less than 20 mg/100 ml: Secondary | ICD-10-CM | POA: Diagnosis not present

## 2022-01-21 DIAGNOSIS — G43109 Migraine with aura, not intractable, without status migrainosus: Secondary | ICD-10-CM | POA: Insufficient documentation

## 2022-01-21 DIAGNOSIS — Z91040 Latex allergy status: Secondary | ICD-10-CM | POA: Diagnosis not present

## 2022-01-21 DIAGNOSIS — R519 Headache, unspecified: Secondary | ICD-10-CM | POA: Diagnosis present

## 2022-01-21 DIAGNOSIS — G529 Cranial nerve disorder, unspecified: Secondary | ICD-10-CM | POA: Insufficient documentation

## 2022-01-21 HISTORY — DX: Anxiety disorder, unspecified: F41.9

## 2022-01-21 HISTORY — DX: Depression, unspecified: F32.A

## 2022-01-21 LAB — COMPREHENSIVE METABOLIC PANEL
ALT: 19 U/L (ref 0–44)
AST: 23 U/L (ref 15–41)
Albumin: 3.8 g/dL (ref 3.5–5.0)
Alkaline Phosphatase: 58 U/L (ref 38–126)
Anion gap: 9 (ref 5–15)
BUN: 13 mg/dL (ref 6–20)
CO2: 23 mmol/L (ref 22–32)
Calcium: 8.8 mg/dL — ABNORMAL LOW (ref 8.9–10.3)
Chloride: 108 mmol/L (ref 98–111)
Creatinine, Ser: 0.8 mg/dL (ref 0.44–1.00)
GFR, Estimated: >60 mL/min (ref >60)
Glucose, Bld: 97 mg/dL (ref 70–99)
Potassium: 4.3 mmol/L (ref 3.5–5.1)
Sodium: 140 mmol/L (ref 135–145)
Total Bilirubin: 0.6 mg/dL (ref 0.3–1.2)
Total Protein: 7.4 g/dL (ref 6.5–8.1)

## 2022-01-21 LAB — CBC WITH DIFFERENTIAL/PLATELET
Abs Immature Granulocytes: 0.02 10*3/uL (ref 0.00–0.07)
Basophils Absolute: 0.1 10*3/uL (ref 0.0–0.1)
Basophils Relative: 1 %
Eosinophils Absolute: 0.2 10*3/uL (ref 0.0–0.5)
Eosinophils Relative: 2 %
HCT: 40.1 % (ref 36.0–46.0)
Hemoglobin: 13.4 g/dL (ref 12.0–15.0)
Immature Granulocytes: 0 %
Lymphocytes Relative: 29 %
Lymphs Abs: 2.4 10*3/uL (ref 0.7–4.0)
MCH: 28.3 pg (ref 26.0–34.0)
MCHC: 33.4 g/dL (ref 30.0–36.0)
MCV: 84.8 fL (ref 80.0–100.0)
Monocytes Absolute: 0.5 10*3/uL (ref 0.1–1.0)
Monocytes Relative: 6 %
Neutro Abs: 5.1 10*3/uL (ref 1.7–7.7)
Neutrophils Relative %: 62 %
Platelets: 273 10*3/uL (ref 150–400)
RBC: 4.73 MIL/uL (ref 3.87–5.11)
RDW: 13.2 % (ref 11.5–15.5)
WBC: 8.2 10*3/uL (ref 4.0–10.5)
nRBC: 0 % (ref 0.0–0.2)

## 2022-01-21 LAB — ETHANOL: Alcohol, Ethyl (B): <10 mg/dL (ref <10)

## 2022-01-21 IMAGING — CT CT HEAD W/O CM
3 series · 16 of 46 positions shown, 19 images · non-contrast
Comparison: None.

CLINICAL DATA: Acute neuro deficit. Sudden onset headache. Vision
changes right eye.



[Series 2: head wo · axial · 0.43mm/px · z∈[-149,-29]mm · 10 of 29 slices shown, 13 images]
[im 3/29  brain]
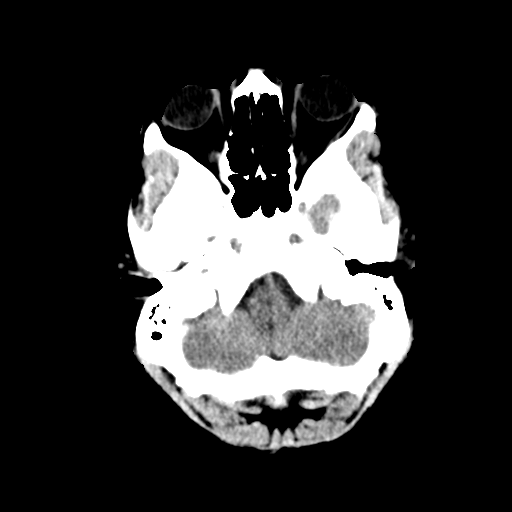
[im 3/29  bone]
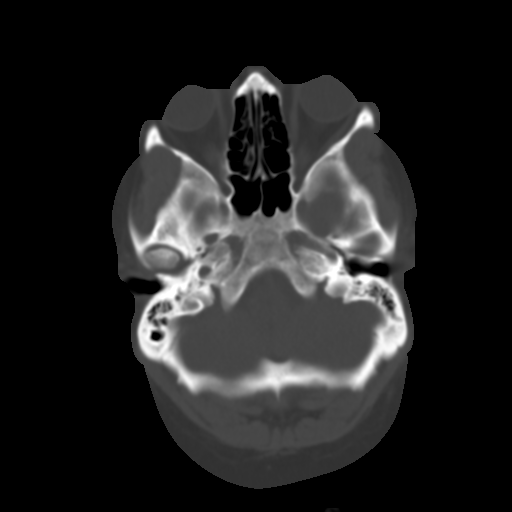
[im 6/29  brain]
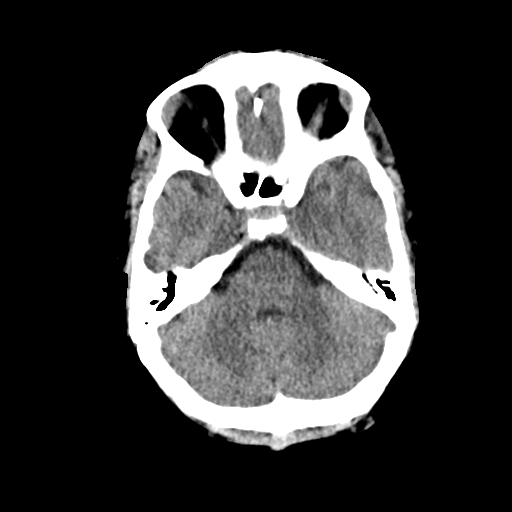
[im 8/29  brain]
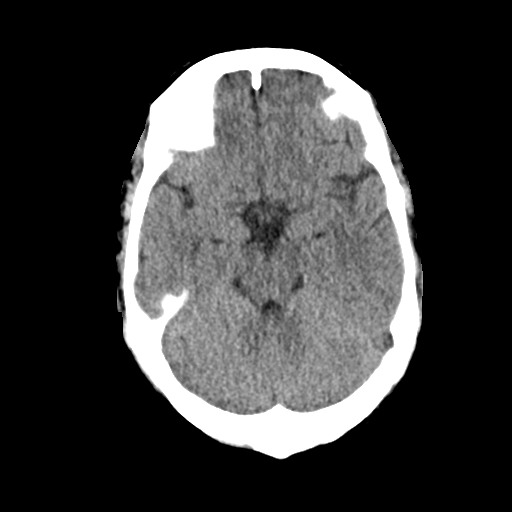
[im 11/29  brain]
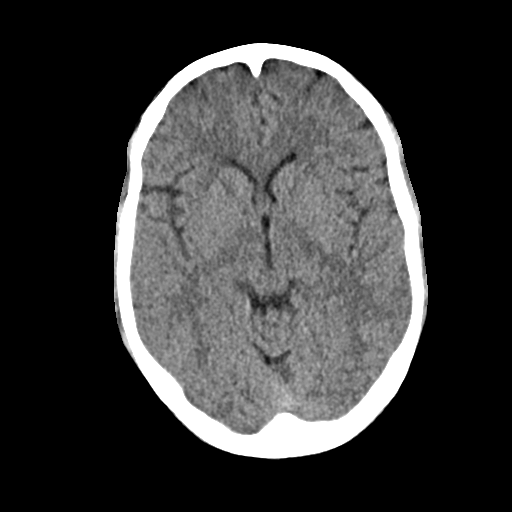
[im 14/29  brain]
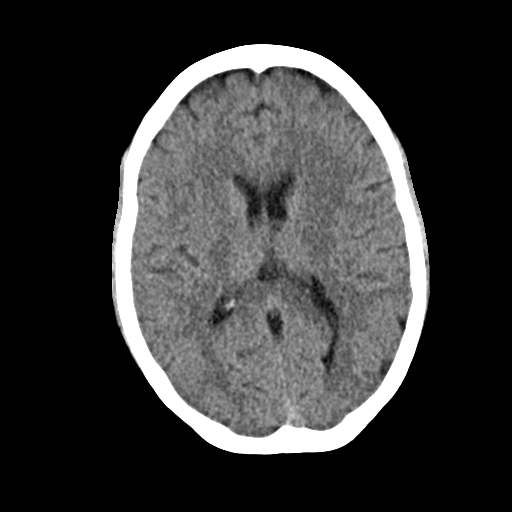
[im 14/29  bone]
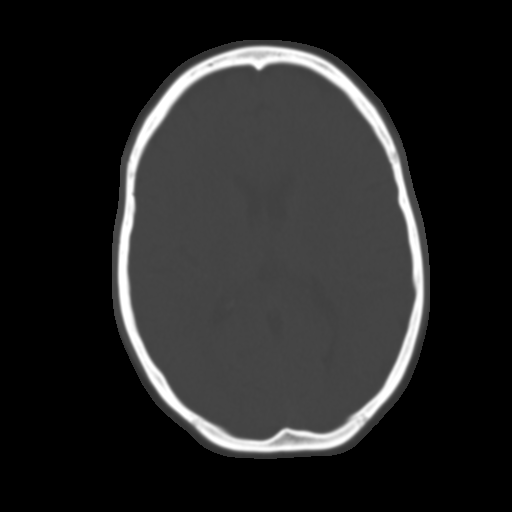
[im 16/29  brain]
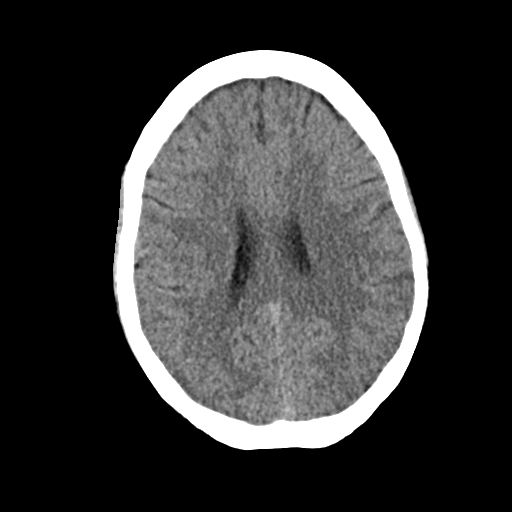
[im 19/29  brain]
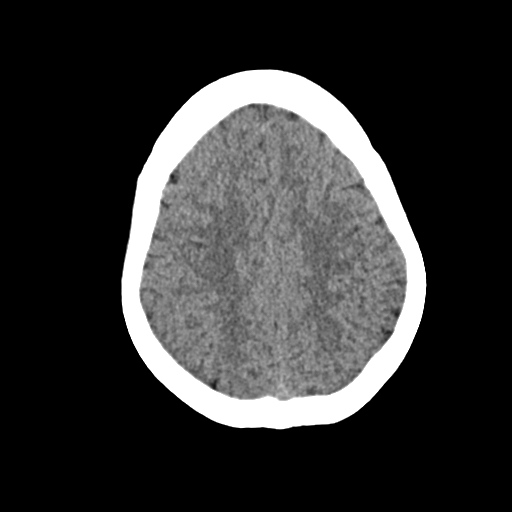
[im 22/29  brain]
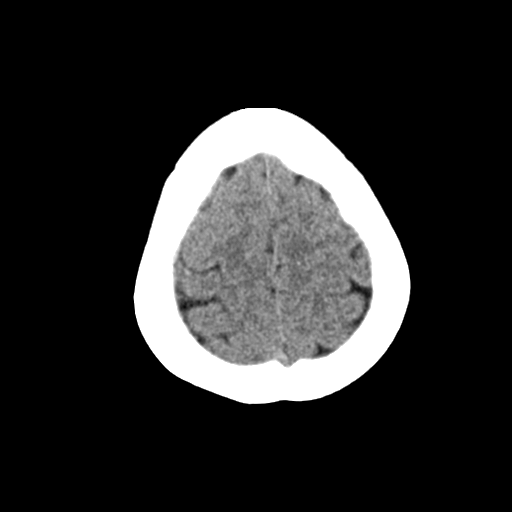
[im 24/29  brain]
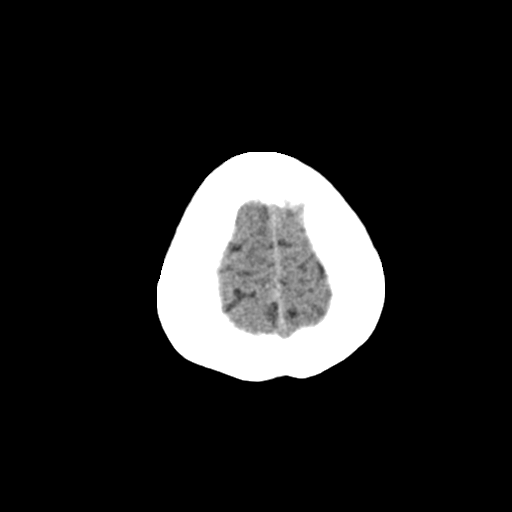
[im 24/29  bone]
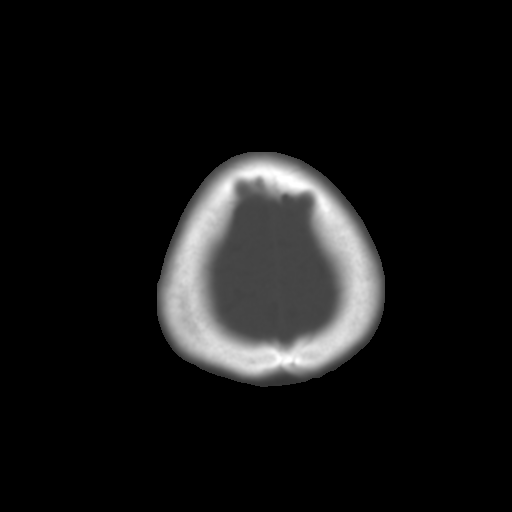
[im 27/29  brain]
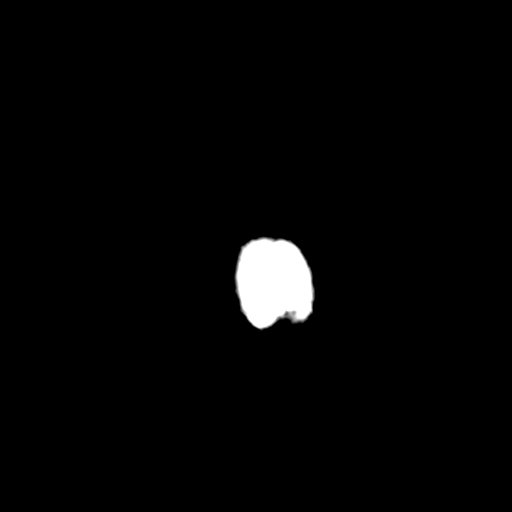

[Series 4: coronal soft · coronal · 0.30mm/px · 3 of 62 slices shown]
[im 21/62  brain]
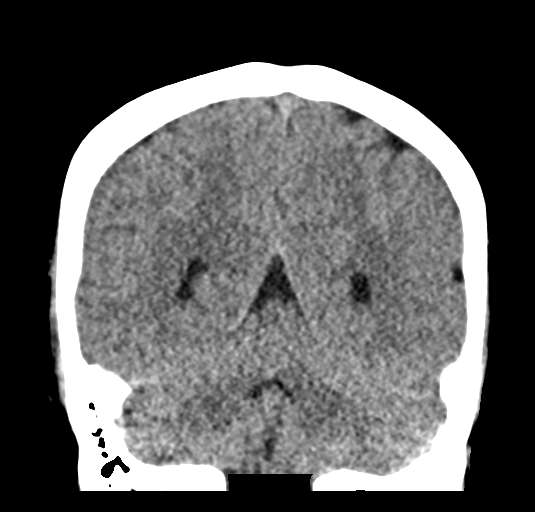
[im 28/62  brain]
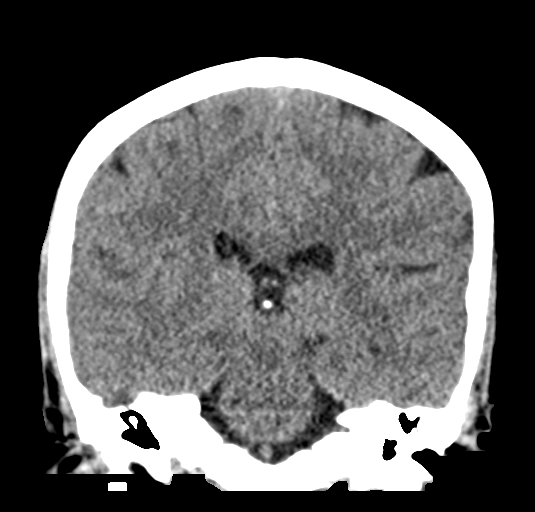
[im 34/62  brain]
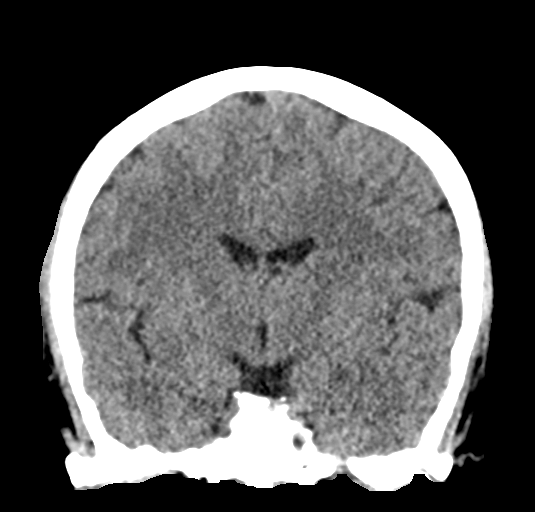

[Series 5: sag soft · sagittal · 0.29mm/px · 3 of 51 slices shown]
[im 17/51  brain]
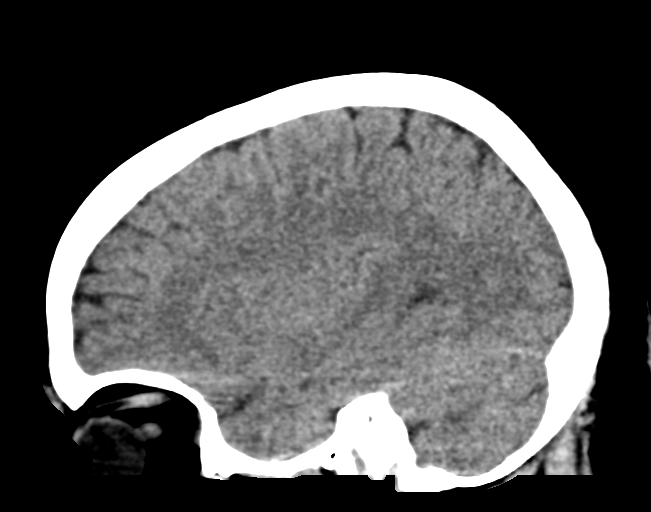
[im 26/51  brain]
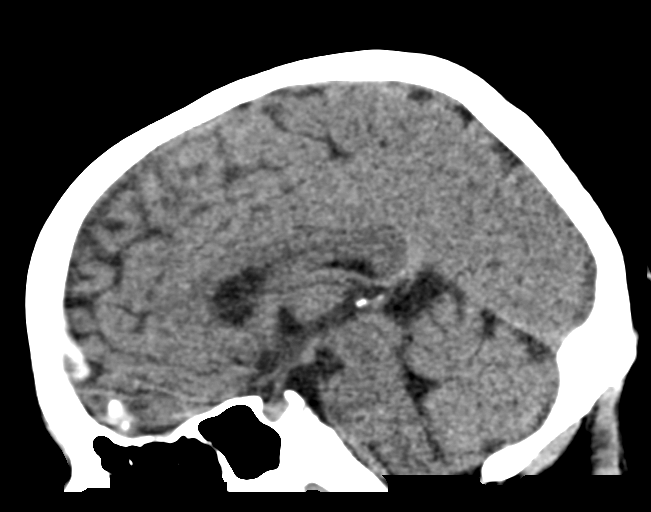
[im 34/51  brain]
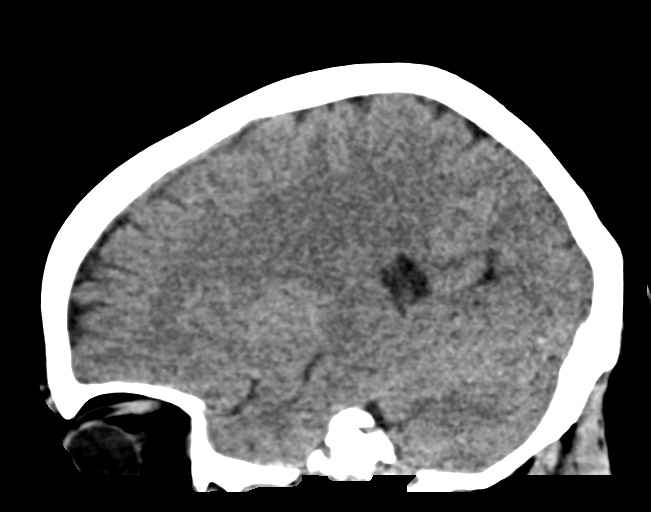

[16 of 46 positions shown; findings below may reference images not displayed]

FINDINGS: Brain: No evidence of acute infarction, hemorrhage, hydrocephalus,
extra-axial collection or mass lesion/mass effect.

Vascular: Negative for hyperdense vessel

Skull: Negative

Sinuses/Orbits: Paranasal sinuses clear.  Negative orbit

Other: None
IMPRESSION: Negative CT head

## 2022-01-21 MED ORDER — DIPHENHYDRAMINE HCL 50 MG/ML IJ SOLN
25.0000 mg | Freq: Once | INTRAMUSCULAR | Status: AC
  Administered 2022-01-21: 25 mg via INTRAVENOUS
  Filled 2022-01-21: qty 1

## 2022-01-21 MED ORDER — SODIUM CHLORIDE 0.9 % IV BOLUS
1000.0000 mL | Freq: Once | INTRAVENOUS | Status: AC
  Administered 2022-01-21: 1000 mL via INTRAVENOUS

## 2022-01-21 MED ORDER — SODIUM CHLORIDE 0.9 % IV SOLN: INTRAVENOUS | Status: DC

## 2022-01-21 MED ORDER — METOCLOPRAMIDE HCL 5 MG/ML IJ SOLN
10.0000 mg | Freq: Once | INTRAMUSCULAR | Status: AC
  Administered 2022-01-21: 10 mg via INTRAVENOUS
  Filled 2022-01-21: qty 2

## 2022-01-21 MED ORDER — DEXAMETHASONE SODIUM PHOSPHATE 10 MG/ML IJ SOLN
10.0000 mg | Freq: Once | INTRAMUSCULAR | Status: AC
  Administered 2022-01-21: 10 mg via INTRAVENOUS
  Filled 2022-01-21: qty 1

## 2022-01-21 NOTE — ED Provider Notes (Addendum)
?[facility] EMERGENCY DEPARTMENT ?Provider Note ? ? ?CSN: [id number] ?Arrival date & time: 01/21/22  0901 ? ?  ? ?History ? ?Chief Complaint  ?Patient presents with  ? Eye Problem  ? Headache  ? ? ?Jill Bass is a 36 y.o. female. ? ?Patient is at 815 was already awake had a sudden severe headache on the top of her head.  And that resulted in some peripheral visual loss with the right eye and she had there was some graying of the lateral aspect of the left eye as well.  No speech problems.  No sensory or motor weakness to the upper extremities or lower extremities.  Patient does have a history of migraines.  Headache is now resolved.  The left lateral peripheral vision has resolved.  And patient still feels as if she is got some right lateral peripheral vision loss.  No photophobia no light sensitivity no neck pain no fevers no nausea or vomiting.  Patient does have a history of complicated migraines.  But this is a little atypical. ? ?Past medical history significant for a complicated migraines polycystic ovarian syndrome kidney stones anxiety and depression.  Patient's had her gallbladder removed.  Patient is a non-smoker. ? ? ?  ? ?Home Medications ?Prior to Admission medications   ?Medication Sig Start Date End Date Taking? Authorizing Provider  ?buPROPion (WELLBUTRIN XL) 150 MG 24 hr tablet Take 150 mg by mouth every morning. 12/29/21   [provider]  ?pantoprazole (PROTONIX) 40 MG tablet Take 1 tablet (40 mg total) by mouth 2 (two) times daily. 02/24/21 03/26/21  Narda Bonds, MD  ?simethicone (MYLICON) 125 MG chewable tablet Chew 125 mg by mouth every 6 (six) hours as needed. gas    [provider]  ?   ? ?Allergies    ?Amoxicillin, Penicillins, and Latex   ? ?Review of Systems   ?Review of Systems  ?Constitutional:  Negative for chills and fever.  ?HENT:  Negative for rhinorrhea and sore throat.   ?Eyes:  Positive for visual disturbance. Negative for photophobia and pain.   ?Respiratory:  Negative for cough and shortness of breath.   ?Cardiovascular:  Negative for chest pain and leg swelling.  ?Gastrointestinal:  Negative for abdominal pain, diarrhea, nausea and vomiting.  ?Genitourinary:  Negative for dysuria.  ?Musculoskeletal:  Negative for back pain and neck pain.  ?Skin:  Negative for rash.  ?Neurological:  Positive for headaches. Negative for dizziness and light-headedness.  ?Hematological:  Does not bruise/bleed easily.  ?Psychiatric/Behavioral:  Negative for confusion.   ? ?Physical Exam ?Updated Vital Signs ?BP (!) 163/92   Pulse 82   Temp 98.2 ?F (36.8 ?C) (Oral)   Resp (!) 8   Ht 1.575 m (5\' 2" )   Wt 113.4 kg   LMP 12/22/2021   SpO2 100%   BMI 45.73 kg/m?  ?Physical Exam ?Vitals and nursing note reviewed.  ?Constitutional:   ?   General: She is not in acute distress. ?   Appearance: Normal appearance. She is well-developed. She is not ill-appearing or toxic-appearing.  ?HENT:  ?   Head: Normocephalic and atraumatic.  ?Eyes:  ?   General: No scleral icterus. ?   Extraocular Movements: Extraocular movements intact.  ?   Right eye: No nystagmus.  ?   Left eye: No nystagmus.  ?   Conjunctiva/sclera: Conjunctivae normal.  ?   Pupils: Pupils are equal, round, and reactive to light.  ?   Comments: View of the fundus  of the right eye without any significant abnormalities.  But obviously limited to see the periphery.  ?Cardiovascular:  ?   Rate and Rhythm: Normal rate and regular rhythm.  ?   Heart sounds: No murmur heard. ?Pulmonary:  ?   Effort: Pulmonary effort is normal. No respiratory distress.  ?   Breath sounds: Normal breath sounds.  ?Abdominal:  ?   Palpations: Abdomen is soft.  ?   Tenderness: There is no abdominal tenderness.  ?Musculoskeletal:     ?   General: No swelling.  ?   Cervical back: Neck supple.  ?Skin: ?   General: Skin is warm and dry.  ?   Capillary Refill: Capillary refill takes less than 2 seconds.  ?Neurological:  ?   Mental Status: She is alert  and oriented to person, place, and time.  ?   Cranial Nerves: Cranial nerve deficit present.  ?   Sensory: No sensory deficit.  ?   Motor: No weakness.  ?   Coordination: Coordination normal.  ?   Comments: Patient with peripheral vision changes in the right eye.  None noted in the left eye.  Patient's vision seems to be a little bit worse out laterally.  Does not make any difference with the left eye being covered.  And with the right eye being covered the left eye peripheral vision is completely normal.  Patient without any sensory or any upper extremity or lower extremity weakness.  No speech problems.  ?Psychiatric:     ?   Mood and Affect: Mood normal.  ? ? ?ED Results / Procedures / Treatments   ?Labs ?(all labs ordered are listed, but only abnormal results are displayed) ?Labs Reviewed  ?CBC WITH DIFFERENTIAL/PLATELET  ?COMPREHENSIVE METABOLIC PANEL  ?ETHANOL  ?URINALYSIS, ROUTINE W REFLEX MICROSCOPIC  ?PREGNANCY, URINE  ?RAPID URINE DRUG SCREEN, HOSP PERFORMED  ? ? ?EKG ?EKG Interpretation ? ?Date/Time:  Wednesday January 21 2022 10:30:31 EDT ?Ventricular Rate:  85 ?PR Interval:  147 ?QRS Duration: 80 ?QT Interval:  372 ?QTC Calculation: 443 ?R Axis:   39 ?Text Interpretation: Sinus rhythm Atrial premature complex Confirmed by Vanetta Mulders [phone]) on 01/21/2022 10:32:30 AM ? ?Radiology ?No results found. ? ?Procedures ?Procedures  ? ? ?Medications Ordered in ED ?Medications  ?0.9 %  sodium chloride infusion (has no administration in time range)  ?sodium chloride 0.9 % bolus 1,000 mL (1,000 mLs Intravenous New Bag/Given 01/21/22 1031)  ?dexamethasone (DECADRON) injection 10 mg (10 mg Intravenous Given 01/21/22 1031)  ?metoCLOPramide (REGLAN) injection 10 mg (10 mg Intravenous Given 01/21/22 1031)  ?diphenhydrAMINE (BENADRYL) injection 25 mg (25 mg Intravenous Given 01/21/22 1031)  ? ? ?ED Course/ Medical Decision Making/ A&P ?  ?                        ?Medical Decision Making ?Amount and/or Complexity of Data  Reviewed ?Labs: ordered. ?Radiology: ordered. ? ?Risk ?Prescription drug management. ? ? ?Initial impression was that there was a high probability with a complicated migraine.  Patient was within the code stroke tPA window.  So discussed with Dr. Amada Jupiter on-call for telemetry specialist neurology.  He felt that based on her symptoms that tPA would not be given.  And was more concerned about a primary eye problem.  Said it could suit still be related to a complicated migraine.  He recommended head CT migraine cocktail which was already ordered and then reevaluate. ? ?Patient's labs without any significant abnormalities.  Head CT negative.  Complete metabolic panel is normal ethanol is less than 10 CBC is normal no leukocytosis hemoglobin of 13.4. ? ?States with the migraine cocktail headache was resolved has remained away.  And the vision is back to normal.  [facility]  Patient apparently followed by them for a retinal abnormality in the left eye.  View of the right eye without any obvious abnormalities.  But obviously do not have the ability to see the peripheral very well.  We will have [facility] give them a call.  I will think they need to see her emergently since things have resolved.  But would recommend that they see her this week if possible. ? ?Patient will return for any new or worse symptoms. ? ?Also to summarize after discussion with Dr. Amada JupiterKirkpatrick.  He did not feel the patient was a code stroke.  Also did not recommend MRI. ? ? ?Final Clinical Impression(s) / ED Diagnoses ?Final diagnoses:  ?Complicated migraine  ? ? ?Rx / DC Orders ?ED Discharge Orders   ? ? None  ? ?  ? ? ?  ?Vanetta MuldersZackowski, Fredderick Swanger, MD ?01/21/22 1046 ? ?  ?Vanetta MuldersZackowski, Chrislyn Seedorf, MD ?01/21/22 1047 ? ?  ?Vanetta MuldersZackowski, Leonilda Cozby, MD ?01/21/22 1138 ? ?  ?Vanetta MuldersZackowski, Risha Barretta, MD ?01/21/22 1141 ? ?  ?Vanetta MuldersZackowski, Luster Hechler, MD ?01/21/22 1144 ? ?

## 2022-01-21 NOTE — ED Triage Notes (Signed)
Pt reports sharp pinpointed HA on top of head that started at 0815; sts she has lost peripheral vision to RT eye; sts feels like it does when she has numbing drops put in ?

## 2022-01-21 NOTE — ED Notes (Addendum)
Unable to obtain IV a, 2nd RN to look with [place]  ?

## 2022-01-21 NOTE — Discharge Instructions (Signed)
Symptoms most likely consistent with complicated migraine.  CT head without any acute abnormalities.  Would recommend follow-up with [facility] ophthalmology ideally this week just to have the retina thoroughly checked on the right eye.  Return for any new or worse symptoms.  Try to rest today outside of the sun.  One of the medications with a long-acting steroid called Decadron.  Return for any new or worse symptoms. ?

## 2022-03-14 ENCOUNTER — Encounter (HOSPITAL_BASED_OUTPATIENT_CLINIC_OR_DEPARTMENT_OTHER): Payer: Self-pay | Admitting: Emergency Medicine

## 2022-03-14 ENCOUNTER — Other Ambulatory Visit: Payer: Self-pay

## 2022-03-14 ENCOUNTER — Emergency Department (HOSPITAL_BASED_OUTPATIENT_CLINIC_OR_DEPARTMENT_OTHER)
Admission: EM | Admit: 2022-03-14 | Discharge: 2022-03-15 | Disposition: A | Discharge status: Home / Self Care | Payer: Worker's Compensation | Attending: Emergency Medicine | Admitting: Emergency Medicine

## 2022-03-14 DIAGNOSIS — W228XXA Striking against or struck by other objects, initial encounter: Secondary | ICD-10-CM | POA: Diagnosis not present

## 2022-03-14 DIAGNOSIS — Z91040 Latex allergy status: Secondary | ICD-10-CM | POA: Insufficient documentation

## 2022-03-14 DIAGNOSIS — R519 Headache, unspecified: Secondary | ICD-10-CM | POA: Insufficient documentation

## 2022-03-14 DIAGNOSIS — S0993XA Unspecified injury of face, initial encounter: Secondary | ICD-10-CM | POA: Diagnosis present

## 2022-03-14 MED ORDER — ACETAMINOPHEN 500 MG PO TABS
1000.0000 mg | ORAL_TABLET | Freq: Once | ORAL | Status: AC
  Administered 2022-03-14: 1000 mg via ORAL
  Filled 2022-03-14: qty 2

## 2022-03-14 MED ORDER — IBUPROFEN 800 MG PO TABS
800.0000 mg | ORAL_TABLET | Freq: Once | ORAL | Status: AC
  Administered 2022-03-14: 800 mg via ORAL
  Filled 2022-03-14: qty 1

## 2022-03-14 MED ORDER — SUMATRIPTAN SUCCINATE 6 MG/0.5ML ~~LOC~~ SOLN
6.0000 mg | Freq: Once | SUBCUTANEOUS | Status: AC
  Administered 2022-03-14: 6 mg via SUBCUTANEOUS
  Filled 2022-03-14: qty 0.5

## 2022-03-14 NOTE — ED Triage Notes (Signed)
Pt arrives pov, steady gait, c/o metal paper towel dispenser hitting bridge of nose today. Denies bleeding. C/o HA, also reports dental pain

## 2022-03-15 ENCOUNTER — Emergency Department (HOSPITAL_BASED_OUTPATIENT_CLINIC_OR_DEPARTMENT_OTHER): Payer: Managed Care, Other (non HMO) | Attending: Emergency Medicine

## 2022-03-15 IMAGING — CT CT MAXILLOFACIAL W/O CM
3 series · 15 of 47 positions shown, 18 images · non-contrast
Comparison: CT head [DATE]

CLINICAL DATA: Head trauma, abnormal mental status (Age 18-64y).
Blunt facial trauma.

EXAM:
CT HEAD WITHOUT CONTRAST
CT MAXILLOFACIAL WITHOUT CONTRAST
TECHNIQUE: Multidetector CT imaging of the head and maxillofacial structures
were performed using the standard protocol without intravenous
contrast. Multiplanar CT image reconstructions of the maxillofacial
structures were also generated.
RADIATION DOSE REDUCTION: This exam was performed according to the
departmental dose-optimization program which includes automated
exposure control, adjustment of the mA and/or kV according to
patient size and/or use of iterative reconstruction technique.

[Series 2: max soft · axial · 0.35mm/px · z∈[-264,-124]mm · 9 of 82 slices shown, 12 images]
[im 6/82  brain]
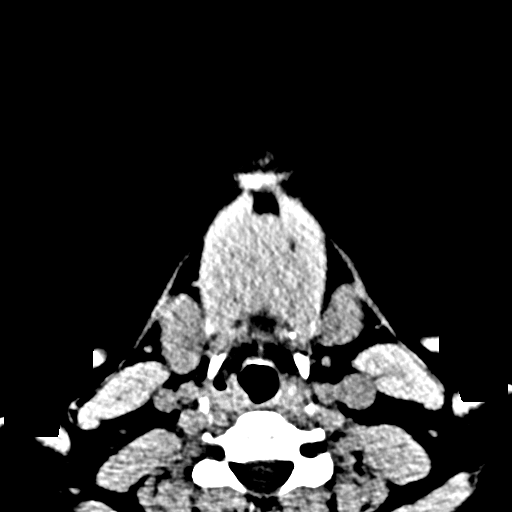
[im 6/82  bone]
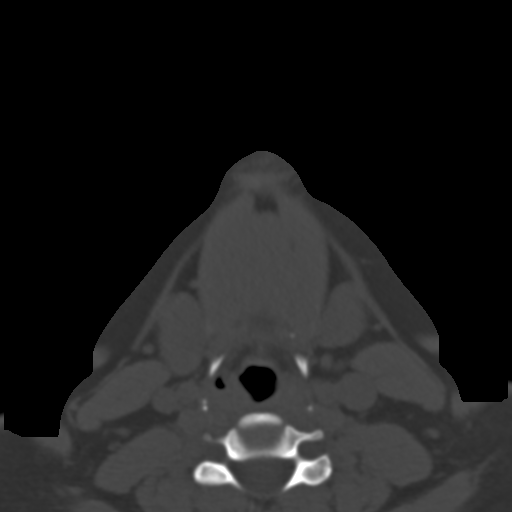
[im 14/82  bone]
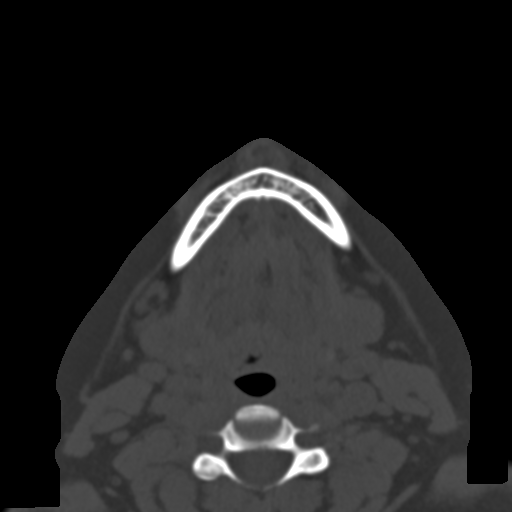
[im 23/82  bone]
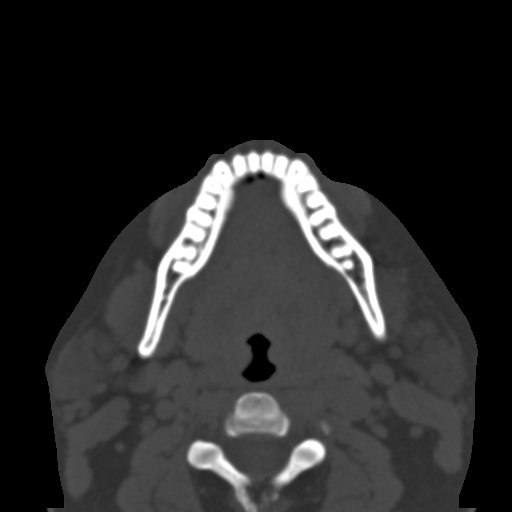
[im 31/82  bone]
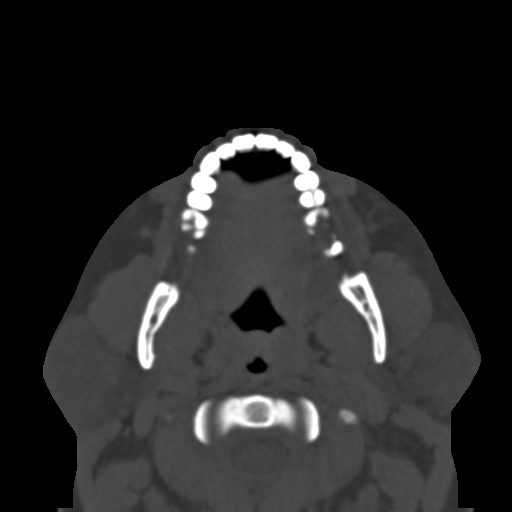
[im 42/82  brain]
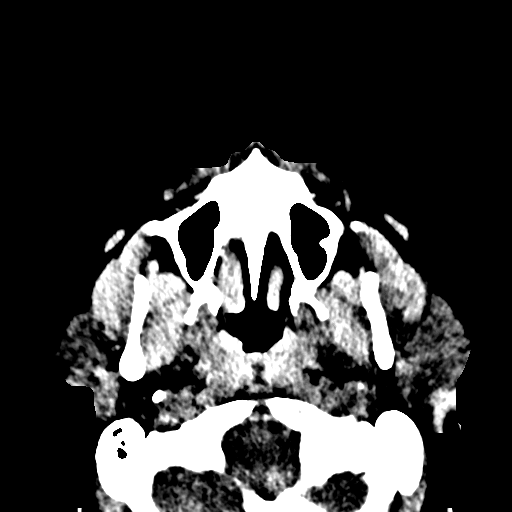
[im 42/82  bone]
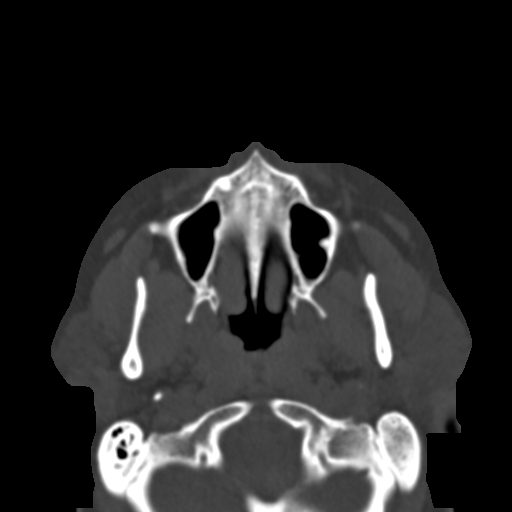
[im 51/82  bone]
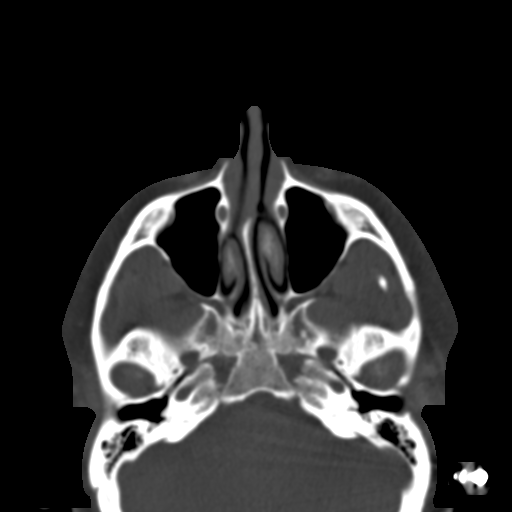
[im 59/82  bone]
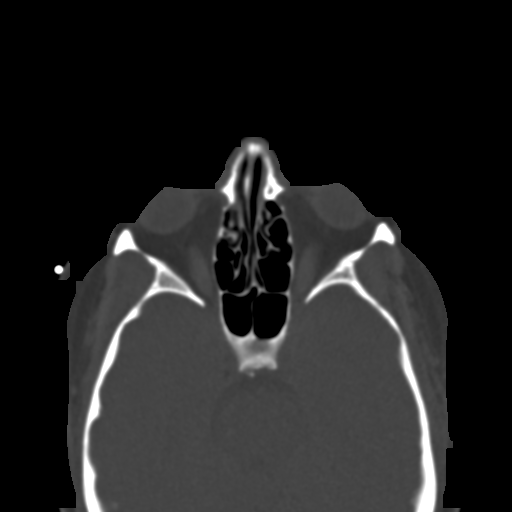
[im 68/82  bone]
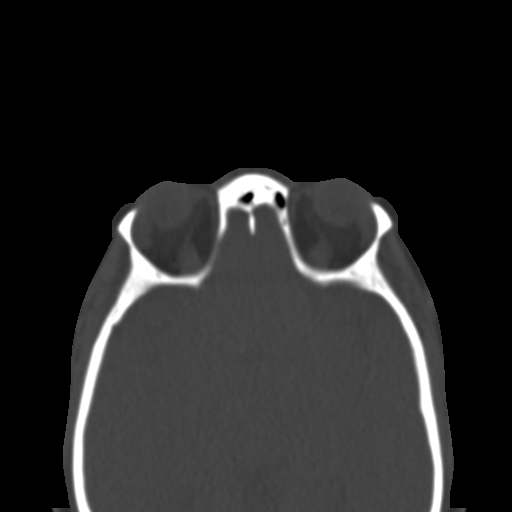
[im 76/82  brain]
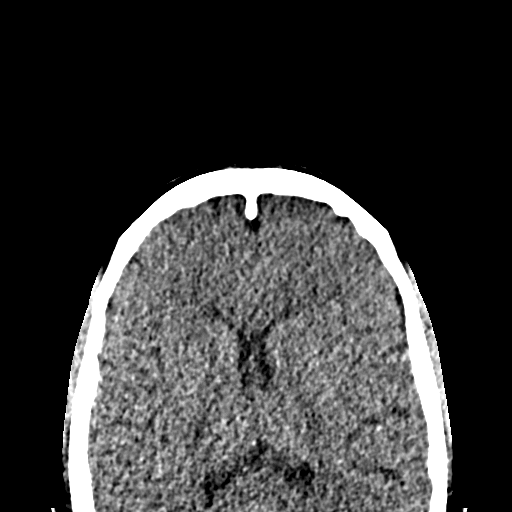
[im 76/82  bone]
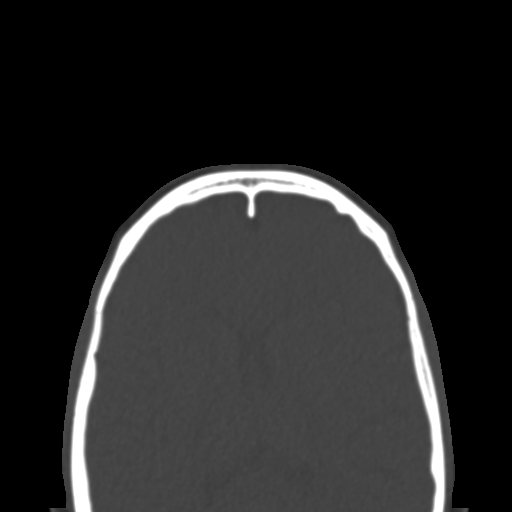

[Series 6: coronal soft · coronal · 0.38mm/px · 3 of 81 slices shown]
[im 27/81  bone]
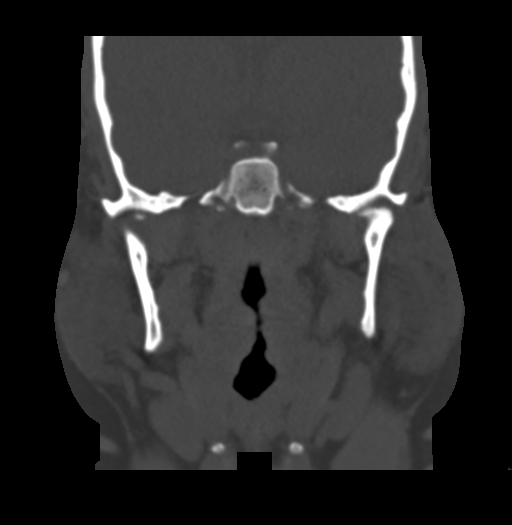
[im 36/81  bone]
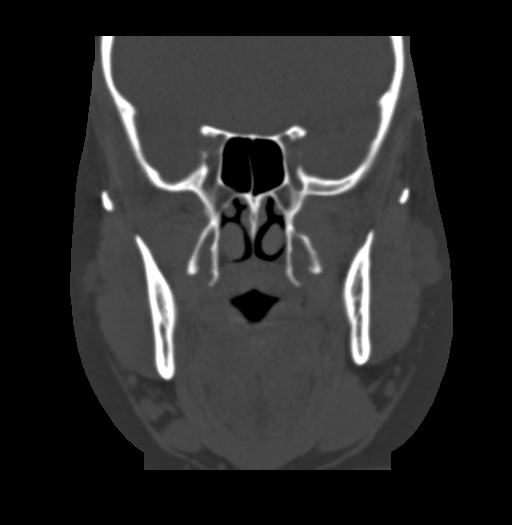
[im 45/81  bone]
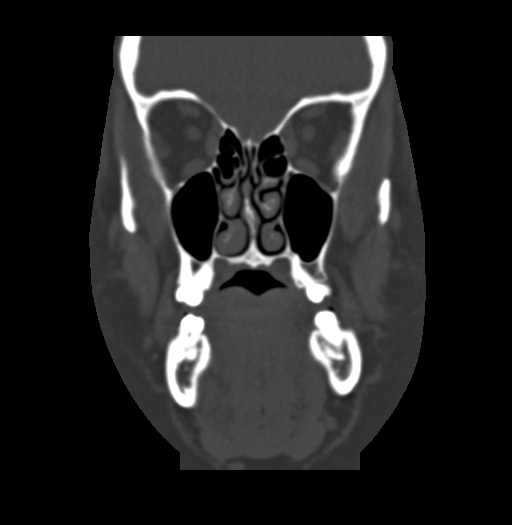

[Series 7: sagittal soft · sagittal · 0.31mm/px · 3 of 97 slices shown]
[im 33/97  bone]
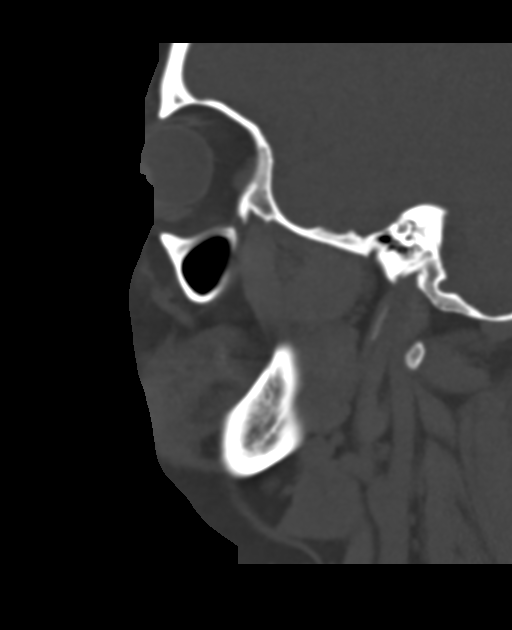
[im 49/97  bone]
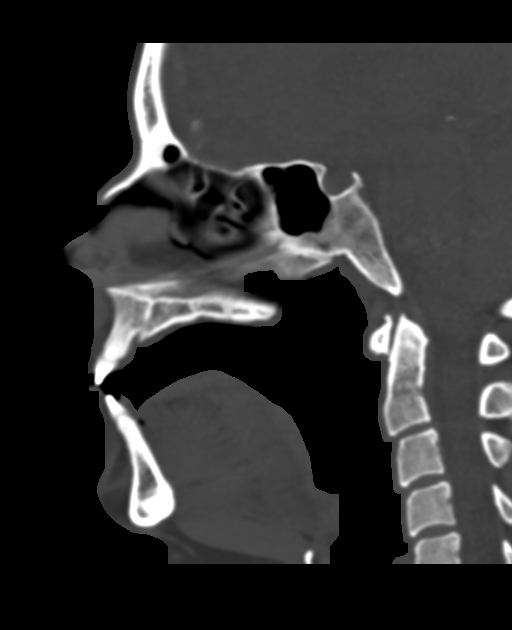
[im 65/97  bone]
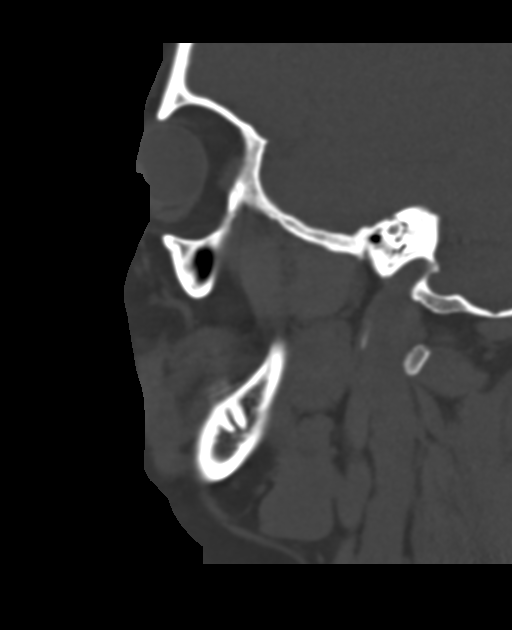

[15 of 47 positions shown; findings below may reference images not displayed]

FINDINGS: CT HEAD FINDINGS

Brain: Normal anatomic configuration. No abnormal intra or
extra-axial mass lesion or fluid collection. No abnormal mass effect
or midline shift. No evidence of acute intracranial hemorrhage or
infarct. Ventricular size is normal. Cerebellum unremarkable.

Vascular: Unremarkable

Skull: Intact

Other: Mastoid air cells and middle ear cavities are clear.

CT MAXILLOFACIAL FINDINGS

Osseous: No fracture or mandibular dislocation. No destructive
process.

Orbits: Negative. No traumatic or inflammatory finding.

Sinuses: Clear.

Soft tissues: Negative.
IMPRESSION: No acute intracranial injury.  No calvarial fracture.

No acute facial fracture or mandibular dislocation.

## 2022-03-15 MED ORDER — IOHEXOL 350 MG/ML SOLN: 75.0000 mL | Freq: Once | INTRAVENOUS | Status: DC | PRN

## 2022-03-15 NOTE — ED Notes (Signed)
Water given for PO challenge.

## 2022-03-15 NOTE — ED Provider Notes (Signed)
[facility] EMERGENCY DEPARTMENT Provider Note   CSN: [id number] Arrival date & time: 03/14/22  1827     History  Chief Complaint  Patient presents with   Facial Injury    Jill Bass is a 36 y.o. female.  The history is provided by the patient.  Facial Injury Injury mechanism: hit in the nose with a paper towel holder. Location:  Nose Time since incident:  5 hours Pain details:    Quality:  Aching   Severity:  Moderate   Timing:  Constant   Progression:  Unchanged Foreign body present:  No foreign bodies Relieved by:  Nothing Worsened by:  Nothing Ineffective treatments:  None tried Associated symptoms: headaches   Associated symptoms: no altered mental status, no vomiting and no wheezing   Risk factors: no alcohol use       Home Medications Prior to Admission medications   Medication Sig Start Date End Date Taking? Authorizing Provider  buPROPion (WELLBUTRIN XL) 150 MG 24 hr tablet Take 150 mg by mouth every morning. 12/29/21   [provider]  pantoprazole (PROTONIX) 40 MG tablet Take 1 tablet (40 mg total) by mouth 2 (two) times daily. 02/24/21 03/26/21  Narda BondsNettey, Ralph A, MD  simethicone (MYLICON) 125 MG chewable tablet Chew 125 mg by mouth every 6 (six) hours as needed. gas    [provider]      Allergies    Amoxicillin, Penicillins, and Latex    Review of Systems   Review of Systems  Constitutional:  Negative for fever.  HENT:  Negative for facial swelling.   Eyes:  Negative for redness.  Respiratory:  Negative for wheezing and stridor.   Gastrointestinal:  Negative for vomiting.  Skin:  Negative for rash.  Neurological:  Positive for headaches.  All other systems reviewed and are negative.  Physical Exam Updated Vital Signs BP (!) 143/87   Pulse 77   Temp 98.2 F (36.8 C) (Oral)   Resp 18   Ht 5\' 2"  (1.575 m)   Wt 123.4 kg   LMP 02/18/2022   SpO2 95%   BMI 49.75 kg/m  Physical Exam Vitals and nursing note  reviewed.  Constitutional:      General: She is not in acute distress.    Appearance: Normal appearance.  HENT:     Head: Normocephalic and atraumatic.     Nose: Nose normal.     Mouth/Throat:     Mouth: Mucous membranes are moist.     Pharynx: Oropharynx is clear.     Comments: Teeth are stable and not loose  Eyes:     Extraocular Movements: Extraocular movements intact.     Conjunctiva/sclera: Conjunctivae normal.     Pupils: Pupils are equal, round, and reactive to light.  Cardiovascular:     Rate and Rhythm: Normal rate and regular rhythm.     Pulses: Normal pulses.     Heart sounds: Normal heart sounds.  Pulmonary:     Effort: Pulmonary effort is normal.     Breath sounds: Normal breath sounds.  Abdominal:     General: Bowel sounds are normal.     Palpations: Abdomen is soft.     Tenderness: There is no abdominal tenderness. There is no guarding.  Musculoskeletal:        General: Normal range of motion.     Cervical back: Normal range of motion and neck supple.  Skin:    General: Skin is warm and dry.  Capillary Refill: Capillary refill takes less than 2 seconds.  Neurological:     General: No focal deficit present.     Mental Status: She is alert and oriented to person, place, and time.     Deep Tendon Reflexes: Reflexes normal.  Psychiatric:        Mood and Affect: Mood normal.        Behavior: Behavior normal.    ED Results / Procedures / Treatments   Labs (all labs ordered are listed, but only abnormal results are displayed) Labs Reviewed - No data to display  EKG None  Radiology CT Head Wo Contrast  Result Date: 03/15/2022 CLINICAL DATA:  Head trauma, abnormal mental status (Age 28-64y). Blunt facial trauma. EXAM: CT HEAD WITHOUT CONTRAST CT MAXILLOFACIAL WITHOUT CONTRAST TECHNIQUE: Multidetector CT imaging of the head and maxillofacial structures were performed using the standard protocol without intravenous contrast. Multiplanar CT image  reconstructions of the maxillofacial structures were also generated. RADIATION DOSE REDUCTION: This exam was performed according to the departmental dose-optimization program which includes automated exposure control, adjustment of the mA and/or kV according to patient size and/or use of iterative reconstruction technique. COMPARISON:  CT head 01/21/2022 FINDINGS: CT HEAD FINDINGS Brain: Normal anatomic configuration. No abnormal intra or extra-axial mass lesion or fluid collection. No abnormal mass effect or midline shift. No evidence of acute intracranial hemorrhage or infarct. Ventricular size is normal. Cerebellum unremarkable. Vascular: Unremarkable Skull: Intact Other: Mastoid air cells and middle ear cavities are clear. CT MAXILLOFACIAL FINDINGS Osseous: No fracture or mandibular dislocation. No destructive process. Orbits: Negative. No traumatic or inflammatory finding. Sinuses: Clear. Soft tissues: Negative. IMPRESSION: No acute intracranial injury.  No calvarial fracture. No acute facial fracture or mandibular dislocation. Electronically Signed   By: Helyn Numbers M.D.   On: 03/15/2022 00:51   CT Maxillofacial Wo Contrast  Result Date: 03/15/2022 CLINICAL DATA:  Head trauma, abnormal mental status (Age 26-64y). Blunt facial trauma. EXAM: CT HEAD WITHOUT CONTRAST CT MAXILLOFACIAL WITHOUT CONTRAST TECHNIQUE: Multidetector CT imaging of the head and maxillofacial structures were performed using the standard protocol without intravenous contrast. Multiplanar CT image reconstructions of the maxillofacial structures were also generated. RADIATION DOSE REDUCTION: This exam was performed according to the departmental dose-optimization program which includes automated exposure control, adjustment of the mA and/or kV according to patient size and/or use of iterative reconstruction technique. COMPARISON:  CT head 01/21/2022 FINDINGS: CT HEAD FINDINGS Brain: Normal anatomic configuration. No abnormal intra or  extra-axial mass lesion or fluid collection. No abnormal mass effect or midline shift. No evidence of acute intracranial hemorrhage or infarct. Ventricular size is normal. Cerebellum unremarkable. Vascular: Unremarkable Skull: Intact Other: Mastoid air cells and middle ear cavities are clear. CT MAXILLOFACIAL FINDINGS Osseous: No fracture or mandibular dislocation. No destructive process. Orbits: Negative. No traumatic or inflammatory finding. Sinuses: Clear. Soft tissues: Negative. IMPRESSION: No acute intracranial injury.  No calvarial fracture. No acute facial fracture or mandibular dislocation. Electronically Signed   By: Helyn Numbers M.D.   On: 03/15/2022 00:51    Procedures Procedures    Medications Ordered in ED Medications  iohexol (OMNIPAQUE) 350 MG/ML injection 75 mL (has no administration in time range)  SUMAtriptan (IMITREX) injection 6 mg (6 mg Subcutaneous Given 03/14/22 2337)  acetaminophen (TYLENOL) tablet 1,000 mg (1,000 mg Oral Given 03/14/22 2337)  ibuprofen (ADVIL) tablet 800 mg (800 mg Oral Given 03/14/22 2337)    ED Course/ Medical Decision Making/ A&P  Medical Decision Making Hit in the face with a paper towel holder   Amount and/or Complexity of Data Reviewed External Data Reviewed: notes.    Details: previous notes reviewed Radiology: ordered and independent interpretation performed.    Details: no fractures  Risk OTC drugs. Prescription drug management. Risk Details: No trauma from paper towel holder. Medication given.  Ice nose for pain.      Final Clinical Impression(s) / ED Diagnoses Final diagnoses:  None   Return for intractable cough, coughing up blood, fevers > 100.4 unrelieved by medication, shortness of breath, intractable vomiting, chest pain, shortness of breath, weakness, numbness, changes in speech, facial asymmetry, abdominal pain, passing out, Inability to tolerate liquids or food, cough, altered mental status or  any concerns. No signs of systemic illness or infection. The patient is nontoxic-appearing on exam and vital signs are within normal limits.  I have reviewed the triage vital signs and the nursing notes. Pertinent labs & imaging results that were available during my care of the patient were reviewed by me and considered in my medical decision making (see chart for details). After history, exam, and medical workup I feel the patient has been appropriately medically screened and is safe for discharge home. Pertinent diagnoses were discussed with the patient. Patient was given return precautions.  Rx / DC Orders ED Discharge Orders     None         Dallas Scorsone, MD 03/15/22 0110

## 2024-08-29 ENCOUNTER — Encounter: Payer: Self-pay | Admitting: Neurology

## 2024-11-14 ENCOUNTER — Ambulatory Visit: Admitting: Neurology

## 2024-11-21 NOTE — Progress Notes (Unsigned)
 "  NEUROLOGY CONSULTATION NOTE  Jill Bass MRN: [id number] DOB: 07-26-1986  Referring provider: Elspeth Coddington, MD Primary care provider: Montie Pizza, MD  Reason for consult:  migriane  Assessment/Plan:   ***   Subjective:  Jill Bass is a 39 year old ***-handed female with PCOS, anxiety, depression and history of kidney stones who presents for migraines.  History supplemented by referring provider's note.  Onset:  *** Location:  *** Quality:  *** Intensity:  ***.  Aura:  *** Prodrome:  *** Postdrome:  *** Associated symptoms:  ***.  *** denies associated unilateral numbness or weakness. Duration:  *** Frequency:  *** Frequency of abortive medication: *** Triggers:  *** Relieving factors:  *** Activity:  ***  She was evaluated by ENT on 08/25/2024 for hyperacusis, aural fullness and chronic tinnitus.  Audiogram revealed mild left-sided high-frequency sensorineural hearing loss and normal tympanometry.  She was diagnosed with eustachian tube dysfunction  Prior imaging: 03/15/2022 CT HEAD WO:  No acute intracranial abnormality.  Past NSAIDS/analgesics:  *** Past abortive triptans:  eletriptan 40mg  Past abortive ergotamine:  none Past muscle relaxants:  none Past anti-emetic:  ondansetron  4mg  Past antihypertensive medications:  *** Past antidepressant/antipsychotic medications:  sertraline Past anticonvulsant medications:  topiramate Past anti-CGRP:  *** Past vitamins/Herbal/Supplements:  *** Past antihistamines/decongestants:  *** Other past therapies:  ***  Current NSAIDS/analgesics:  *** Current triptans:  *** Current ergotamine:  none Current anti-emetic:  none Current muscle relaxants:  none Current Antihypertensive medications:  none Current Antidepressant/antipsychotic medications:  Wellbutrin XL 150mg  daily Current Anticonvulsant medications:  none Current anti-CGRP:  none Current Vitamins/Herbal/Supplements:  none Current  Antihistamines/Decongestants:  none Other therapy:  *** Birth control:  none    Caffeine:  *** Alcohol:  *** Smoker:  *** Diet:  *** Exercise:  *** Depression:  ***; Anxiety:  *** Other pain:  *** Sleep hygiene:  ***  History of TBI/concussion:  *** Family history of headache:  *** Family history of cerebral aneurysm:  ***      PAST MEDICAL HISTORY: Past Medical History:  Diagnosis Date   Anxiety and depression    Kidney stones    Migraine    PCOS (polycystic ovarian syndrome)     PAST SURGICAL HISTORY: Past Surgical History:  Procedure Laterality Date   BIOPSY  02/24/2021   Procedure: BIOPSY;  Surgeon: Saintclair Jasper, MD;  Location: [facility];  Service: Gastroenterology;;   CHOLECYSTECTOMY N/A 02/03/2021   Procedure: LAPAROSCOPIC CHOLECYSTECTOMY WITH INTRAOPERATIVE CHOLANGIOGRAM;  Surgeon: Belinda Cough, MD;  Location: [facility] OR;  Service: General;  Laterality: N/A;   ESOPHAGOGASTRODUODENOSCOPY (EGD) WITH PROPOFOL  N/A 02/24/2021   Procedure: ESOPHAGOGASTRODUODENOSCOPY (EGD) WITH PROPOFOL ;  Surgeon: Saintclair Jasper, MD;  Location: [facility];  Service: Gastroenterology;  Laterality: N/A;   TYMPANOSTOMY TUBE PLACEMENT      MEDICATIONS: Medications Ordered Prior to Encounter[1]  ALLERGIES: Allergies[2]  FAMILY HISTORY: No family history on file.  Objective:  *** General: No acute distress.  Patient appears well-groomed.   Head:  Normocephalic/atraumatic Eyes:  fundi examined but not visualized Neck: supple, no paraspinal tenderness, full range of motion Heart: regular rate and rhythm Neurological Exam: Mental status: alert and oriented to person, place, and time, speech fluent and not dysarthric, language intact. Cranial nerves: CN I: not tested CN II: pupils equal, round and reactive to light, visual fields intact CN III, IV, VI:  full range of motion, no nystagmus, no ptosis CN V: facial sensation intact. CN VII: upper and lower face symmetric CN VIII:  hearing  intact CN IX, X: gag intact, uvula midline CN XI: sternocleidomastoid and trapezius muscles intact CN XII: tongue midline Bulk & Tone: normal, no fasciculations. Motor:  muscle strength 5/5 throughout Sensation:  Pinprick and vibratory sensation intact. Deep Tendon Reflexes:  2+ throughout,  toes downgoing.   Finger to nose testing:  Without dysmetria.   Gait:  Normal station and stride.  Romberg negative.    Juliene Dunnings, DO  CC:  Elspeth Coddington, MD  Montie Pizza, MD        [1]  Current Outpatient Medications on File Prior to Visit  Medication Sig Dispense Refill   buPROPion (WELLBUTRIN XL) 150 MG 24 hr tablet Take 150 mg by mouth every morning.     pantoprazole  (PROTONIX ) 40 MG tablet Take 1 tablet (40 mg total) by mouth 2 (two) times daily. 60 tablet 0   simethicone  (MYLICON) 125 MG chewable tablet Chew 125 mg by mouth every 6 (six) hours as needed. gas     No current facility-administered medications on file prior to visit.  [2]  Allergies Allergen Reactions   Amoxicillin Hives   Penicillins Hives   Latex Rash   "

## 2024-11-22 ENCOUNTER — Encounter: Payer: Self-pay | Admitting: Neurology

## 2024-11-22 ENCOUNTER — Telehealth: Payer: Self-pay

## 2024-11-22 ENCOUNTER — Ambulatory Visit: Admitting: Neurology

## 2024-11-22 VITALS — BP 138/80 | HR 84 | Ht 62.0 in | Wt 250.0 lb

## 2024-11-22 DIAGNOSIS — G43109 Migraine with aura, not intractable, without status migrainosus: Secondary | ICD-10-CM

## 2024-11-22 MED ORDER — SUMATRIPTAN SUCCINATE 100 MG PO TABS: 100.0000 mg | ORAL_TABLET | ORAL | Status: AC | PRN

## 2024-11-22 MED ORDER — QULIPTA 60 MG PO TABS: 60.0000 mg | ORAL_TABLET | Freq: Every day | ORAL | 11 refills | Status: AC

## 2024-11-22 NOTE — Patient Instructions (Signed)
" °  Start QULIPTA  60MG  DAILY.  Contact us  in 3 months with update  Take SUMATRIPTAN  at earliest onset of headache.  May repeat dose once in 2 hours if needed.  Maximum 2 tablets in 24 hours. Limit use of pain relievers to no more than 9 days out of the month.  These medications include acetaminophen , NSAIDs (ibuprofen /Advil /Motrin , naproxen/Aleve, triptans (Imitrex /sumatriptan ), Excedrin, and narcotics.  This will help reduce risk of rebound headaches. Be aware of common food triggers Routine exercise Stay adequately hydrated (aim for 64 oz water daily) Keep headache diary Maintain proper stress management Maintain proper sleep hygiene Do not skip meals Consider supplements:  magnesium citrate 400mg  daily, riboflavin 400mg  daily, coenzyme Q10 100mg  three times daily OR MigreLief combination pill twice daily.  "

## 2024-11-22 NOTE — Telephone Encounter (Signed)
 PA needed for [facility]

## 2024-11-28 ENCOUNTER — Telehealth: Payer: Self-pay | Admitting: Pharmacy Technician

## 2024-11-28 ENCOUNTER — Other Ambulatory Visit (HOSPITAL_COMMUNITY): Payer: Self-pay

## 2024-11-28 NOTE — Telephone Encounter (Signed)
 PA has been submitted, and telephone encounter has been created. Please see telephone encounter dated 2.3.26.

## 2024-11-28 NOTE — Telephone Encounter (Signed)
 Pharmacy Patient Advocate Encounter   Received notification from Pt Calls Messages that prior authorization for QULIPTA  60MG  is required/requested.   Insurance verification completed.   The patient is insured through [employer].   Per test claim: PA required; PA submitted to above mentioned insurance via Latent Key/confirmation #/[id number] Status is pending

## 2024-12-21 ENCOUNTER — Ambulatory Visit: Admitting: Neurology

## 2024-12-25 ENCOUNTER — Ambulatory Visit: Admitting: Neurology

## 2025-05-28 ENCOUNTER — Ambulatory Visit: Payer: Self-pay | Admitting: Neurology
